# Patient Record
Sex: Female | Born: 1980 | Race: White | Hispanic: No | Marital: Married | Smoking: Former smoker
Health system: Southern US, Community
[De-identification: ages and names within clinical notes are randomized; demographics above are authoritative.]

## PROBLEM LIST (undated history)

## (undated) DIAGNOSIS — Z862 Personal history of diseases of the blood and blood-forming organs and certain disorders involving the immune mechanism: Secondary | ICD-10-CM

## (undated) DIAGNOSIS — J3489 Other specified disorders of nose and nasal sinuses: Secondary | ICD-10-CM

## (undated) DIAGNOSIS — Z8759 Personal history of other complications of pregnancy, childbirth and the puerperium: Secondary | ICD-10-CM

## (undated) DIAGNOSIS — Z973 Presence of spectacles and contact lenses: Secondary | ICD-10-CM

## (undated) DIAGNOSIS — E042 Nontoxic multinodular goiter: Secondary | ICD-10-CM

## (undated) DIAGNOSIS — J302 Other seasonal allergic rhinitis: Secondary | ICD-10-CM

## (undated) DIAGNOSIS — J45990 Exercise induced bronchospasm: Secondary | ICD-10-CM

## (undated) DIAGNOSIS — D497 Neoplasm of unspecified behavior of endocrine glands and other parts of nervous system: Secondary | ICD-10-CM

## (undated) DIAGNOSIS — K219 Gastro-esophageal reflux disease without esophagitis: Secondary | ICD-10-CM

## (undated) HISTORY — PX: TYMPANOSTOMY TUBE PLACEMENT: SHX32

---

## 2012-02-10 ENCOUNTER — Ambulatory Visit (INDEPENDENT_AMBULATORY_CARE_PROVIDER_SITE_OTHER): Payer: 59 | Admitting: Family Medicine

## 2012-02-10 VITALS — BP 114/79 | HR 100 | Temp 98.8°F | Resp 18 | Ht 61.5 in | Wt 137.0 lb

## 2012-02-10 DIAGNOSIS — J45909 Unspecified asthma, uncomplicated: Secondary | ICD-10-CM

## 2012-02-10 DIAGNOSIS — R05 Cough: Secondary | ICD-10-CM

## 2012-02-10 DIAGNOSIS — J45901 Unspecified asthma with (acute) exacerbation: Secondary | ICD-10-CM

## 2012-02-10 DIAGNOSIS — R059 Cough, unspecified: Secondary | ICD-10-CM

## 2012-02-10 MED ORDER — ALBUTEROL SULFATE HFA 108 (90 BASE) MCG/ACT IN AERS: 2.0000 | INHALATION_SPRAY | Freq: Four times a day (QID) | RESPIRATORY_TRACT | Status: DC | PRN

## 2012-02-10 MED ORDER — ALBUTEROL SULFATE (2.5 MG/3ML) 0.083% IN NEBU
2.5000 mg | INHALATION_SOLUTION | Freq: Once | RESPIRATORY_TRACT | Status: AC
  Administered 2012-02-10: 2.5 mg via RESPIRATORY_TRACT

## 2012-02-10 MED ORDER — AZITHROMYCIN 250 MG PO TABS: ORAL_TABLET | ORAL | Status: AC

## 2012-02-10 NOTE — Patient Instructions (Signed)
Drink plenty of fluids. Continue using the Mucinex if helpful. Get plenty of rest. Stay off work today.  Use prescription medications as ordered.

## 2012-02-10 NOTE — Progress Notes (Signed)
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 Has not been running any fever. Had initially a sore throat and head congestion. Now it has moved into her chest with a lot of coughing and wheezing. She is listen to herself and can hear herself wheezing. She took some of her husbands Tessalon. She does not smoke. She has a history of getting a lot of flulike infections through a lot of work. Her husband has not been ill for about 6 weeks. The cough kept her awake. She is missing work today.  Review of systems was otherwise unremarkable for the ears abdomen and head  Objective Alert oriented white female in no acute distress at this time. Her TMs are normal. Nose mild congestion. Throat without erythema. Neck supple without nodes thyromegaly. Chest has diffuse wheezing. She keeps coughing. No rales or rhonchi. Heart regular without murmurs.  Assessment: Acute asthmatic bronchitis Cough  Plan: Albuterol nebulized treatment. Then will decide on home prescription.  Peak flow improved to 300 after a single treatment. Lungs sounded much better.

## 2012-12-15 ENCOUNTER — Ambulatory Visit (INDEPENDENT_AMBULATORY_CARE_PROVIDER_SITE_OTHER): Payer: 59 | Admitting: Physician Assistant

## 2012-12-15 ENCOUNTER — Encounter: Payer: Self-pay | Admitting: Physician Assistant

## 2012-12-15 VITALS — BP 123/79 | HR 81 | Temp 97.6°F | Resp 16 | Ht 63.0 in | Wt 145.8 lb

## 2012-12-15 DIAGNOSIS — J019 Acute sinusitis, unspecified: Secondary | ICD-10-CM

## 2012-12-15 DIAGNOSIS — J029 Acute pharyngitis, unspecified: Secondary | ICD-10-CM

## 2012-12-15 LAB — POCT INFLUENZA A/B
Influenza A, POC: NEGATIVE
Influenza B, POC: NEGATIVE

## 2012-12-15 LAB — POCT RAPID STREP A (OFFICE): Rapid Strep A Screen: NEGATIVE

## 2012-12-15 MED ORDER — IPRATROPIUM BROMIDE 0.06 % NA SOLN: 2.0000 | Freq: Three times a day (TID) | NASAL | Status: DC

## 2012-12-15 MED ORDER — LEVOFLOXACIN 500 MG PO TABS: 500.0000 mg | ORAL_TABLET | Freq: Every day | ORAL | Status: DC

## 2012-12-15 NOTE — Progress Notes (Signed)
Patient ID: Nicole [name] MRN: [id number], DOB: [birth year], 32 y.o. Date of Encounter: 12/15/2012, 10:25 AM  Primary Physician: No primary provider on file.  Chief Complaint:  Chief Complaint  Patient presents with  . Cough    productive cough nasal congestion chest congestion fever chills on and off x 3 1/2 weeks    HPI: 32 y.o. year old female presents with 3 day history of nasal congestion, post nasal drip, sore throat, and sinus pressure. Patient was initially sick 3 and a half weeks ago with chest congestion and cough which she treated with OTC medications and left over Tessalon. Symptoms resolved. She now presents with increased thick green/blood tinged nasal sputum and sinus pressure for three days. Afebrile. No chills. Sinus pressure is the worst symptom. No cough, wheezing, chest pain, or shortness of breath. Ears feel full, leading to sensation of muffled hearing. Has tried OTC cold preps without much success this time. No GI complaints. Appetite normal. She is a Engineer, civil (consulting) at [employer] and has been around multiple sick people, some with influenza. No recent antibiotics or recent travels. No leg trauma, sedentary periods, history of cancer, or current tobacco use. She did receive her influenza vaccine this year.    Past Medical History  Diagnosis Date  . Asthma      Home Meds: Prior to Admission medications   Medication Sig Start Date End Date Taking? Authorizing Provider  acetaminophen (TYLENOL) 325 MG tablet Take 650 mg by mouth every 6 (six) hours as needed.   Yes Historical Provider, MD  albuterol (PROVENTIL HFA;VENTOLIN HFA) 108 (90 BASE) MCG/ACT inhaler Inhale 2 puffs into the lungs every 6 (six) hours as needed for wheezing. 02/10/12 02/09/13 Yes Peyton Najjar, MD  guaiFENesin (MUCINEX) 600 MG 12 hr tablet Take 1,200 mg by mouth 2 (two) times daily.   Yes Historical Provider, MD  albuterol (PROVENTIL) (2.5 MG/3ML) 0.083% nebulizer solution Take 2.5 mg by nebulization every 6  (six) hours as needed.    Historical Provider, MD  benzonatate (TESSALON) 100 MG capsule Take 100 mg by mouth 3 (three) times daily as needed.    Historical Provider, MD                  Allergies: No Known Allergies  History   Social History  . Marital Status: Married    Spouse Name: N/A    Number of Children: N/A  . Years of Education: N/A   Occupational History  . Not on file.   Social History Main Topics  . Smoking status: Former Games developer  . Smokeless tobacco: Not on file  . Alcohol Use: Not on file  . Drug Use: Not on file  . Sexually Active: Not on file   Other Topics Concern  . Not on file   Social History Narrative  . No narrative on file     Review of Systems: Constitutional: positive for fatigue. negative for chills, fever, night sweats or weight changes HEENT: see above Cardiovascular: negative for chest pain or palpitations Respiratory: negative for cough, hemoptysis, wheezing, or shortness of breath Abdominal: negative for abdominal pain, nausea, vomiting or diarrhea Dermatological: negative for rash Neurologic: negative for headache   Physical Exam: Blood pressure 123/79, pulse 81, temperature 97.6 F (36.4 C), temperature source Oral, resp. rate 16, height 5\' 3"  (1.6 m), weight 145 lb 12.8 oz (66.134 kg), SpO2 99.00%., Body mass index is 25.83 kg/(m^2). General: Well developed, well nourished, in no acute distress. Head: Normocephalic, atraumatic,  eyes without discharge, sclera non-icteric, nares are congested. Bilateral auditory canals clear, TM's are without perforation, pearly grey with reflective cone of light bilaterally. Serous effusion bilaterally behind TM's. Maxillary sinus TTP. Oral cavity moist, dentition normal. Posterior pharynx with post nasal drip and mild erythema. No peritonsillar abscess or tonsillar exudate. Uvula midline. Neck: Supple. No thyromegaly. Full ROM. No lymphadenopathy. Lungs: Clear bilaterally to auscultation without  wheezes, rales, or rhonchi. Breathing is unlabored.  Heart: RRR with S1 S2. No murmurs, rubs, or gallops appreciated. Msk:  Strength and tone normal for age. Extremities: No clubbing or cyanosis. No edema. Neuro: Alert and oriented X 3. Moves all extremities spontaneously. CNII-XII grossly in tact. Psych:  Responds to questions appropriately with a normal affect.   Labs: Results for orders placed in visit on 12/15/12  POCT INFLUENZA A/B      Component Value Range   Influenza A, POC Negative     Influenza B, POC Negative    POCT RAPID STREP A (OFFICE)      Component Value Range   Rapid Strep A Screen Negative  Negative   Throat culture pending  ASSESSMENT AND PLAN:  32 y.o. year old female with sinusitis  -Levaquin 500 mg 1 po daily #10 no RF -Atrovent NS 0.06% 2 sprays each nare bid prn #1 no RF -Mucinex -Tylenol/Motrin prn -May call for Tessalon refill if needed -Rest/fluids -RTC precautions -RTC 3-5 days if no improvement  Signed, Eula Listen, PA-C 12/15/2012 10:25 AM

## 2012-12-16 NOTE — Progress Notes (Signed)
Quick Note:  Await final results.  Eula Listen, PA-C 12/16/2012 11:56 AM ______

## 2012-12-17 LAB — CULTURE, GROUP A STREP: Organism ID, Bacteria: NORMAL

## 2013-04-10 ENCOUNTER — Ambulatory Visit (INDEPENDENT_AMBULATORY_CARE_PROVIDER_SITE_OTHER): Payer: 59 | Admitting: Physician Assistant

## 2013-04-10 VITALS — BP 114/77 | HR 80 | Temp 98.2°F | Resp 16 | Ht 62.25 in | Wt 140.0 lb

## 2013-04-10 DIAGNOSIS — L299 Pruritus, unspecified: Secondary | ICD-10-CM

## 2013-04-10 DIAGNOSIS — L255 Unspecified contact dermatitis due to plants, except food: Secondary | ICD-10-CM

## 2013-04-10 MED ORDER — METHYLPREDNISOLONE ACETATE 80 MG/ML IJ SUSP
80.0000 mg | Freq: Once | INTRAMUSCULAR | Status: AC
  Administered 2013-04-10: 80 mg via INTRAMUSCULAR

## 2013-04-10 MED ORDER — TRIAMCINOLONE ACETONIDE 0.5 % EX CREA: TOPICAL_CREAM | Freq: Three times a day (TID) | CUTANEOUS | Status: DC

## 2013-04-10 NOTE — Progress Notes (Signed)
   [address], [place] [address]   Phone [phone]  Subjective:    Patient ID: Nicole [name], female    DOB: [birth year], 32 y.o.   MRN: [id number]  HPI Pt presents to clinic with rhus dermatitis for about 11 days.  Last Sunday she was working in the yard and moved some tree limbs and then the next day she woke up with poison ivy.  She has been using benadryl and calamine type lotions and they are not really helping the itch.  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  She is still getting new lesions today.  She has never had position ivy before.   Review of Systems  Skin: Positive for rash.       Objective:   Physical Exam  Vitals reviewed. Constitutional: She is oriented to person, place, and time. She appears well-developed and well-nourished.  HENT:  Head: Normocephalic and atraumatic.  Right Ear: External ear normal.  Left Ear: External ear normal.  Pulmonary/Chest: Effort normal.  Neurological: She is alert and oriented to person, place, and time.  Skin: Skin is warm and dry. Rash noted. Rash is vesicular (with erythematous base comsistent with rhus dermatitis). Rash is not pustular.     Psychiatric: She has a normal mood and affect. Her behavior is normal. Judgment and thought content normal.          Assessment & Plan:  Rhus dermatitis - Plan: methylPREDNISolone acetate (DEPO-MEDROL) injection 80 mg, triamcinolone cream (KENALOG) 0.5 %  Itching - pt can continue benadryl at night.  She should continue her Zyrtec during the day.  Benny Lennert PA-C 04/10/2013 7:39 PM

## 2013-04-17 ENCOUNTER — Other Ambulatory Visit: Payer: Self-pay | Admitting: Physician Assistant

## 2013-11-01 ENCOUNTER — Encounter: Payer: Self-pay | Admitting: Certified Nurse Midwife

## 2013-11-01 ENCOUNTER — Ambulatory Visit: Payer: 59 | Admitting: Certified Nurse Midwife

## 2013-11-06 ENCOUNTER — Ambulatory Visit: Payer: 59 | Admitting: Certified Nurse Midwife

## 2013-11-23 ENCOUNTER — Ambulatory Visit (INDEPENDENT_AMBULATORY_CARE_PROVIDER_SITE_OTHER): Payer: 59 | Admitting: Certified Nurse Midwife

## 2013-11-23 ENCOUNTER — Encounter: Payer: Self-pay | Admitting: Certified Nurse Midwife

## 2013-11-23 VITALS — BP 104/60 | HR 64 | Resp 16 | Ht 62.0 in | Wt 146.0 lb

## 2013-11-23 DIAGNOSIS — Z0000 Encounter for general adult medical examination without abnormal findings: Secondary | ICD-10-CM

## 2013-11-23 DIAGNOSIS — R319 Hematuria, unspecified: Secondary | ICD-10-CM

## 2013-11-23 DIAGNOSIS — Z01419 Encounter for gynecological examination (general) (routine) without abnormal findings: Secondary | ICD-10-CM

## 2013-11-23 LAB — POCT URINALYSIS DIPSTICK
Bilirubin, UA: NEGATIVE
Glucose, UA: NEGATIVE
Ketones, UA: NEGATIVE
Nitrite, UA: NEGATIVE
Protein, UA: NEGATIVE
Urobilinogen, UA: NEGATIVE
pH, UA: 5

## 2013-11-23 LAB — HEMOGLOBIN, FINGERSTICK: Hemoglobin, fingerstick: 13.1 g/dL (ref 12.0–16.0)

## 2013-11-23 NOTE — Progress Notes (Signed)
32 y.o. G0P0000 Married Caucasian Fe here for annual exam.  Periods normal, no issues. Condoms working well for contraception. Denies urinary, urgency or pain. History of micro hematuria. Patient has noted increase frequency only. Request referral list for PCP. Currently on Z pack for Sinusitis. Sees PCP prn. No other health issues today.  Patient's last menstrual period was 11/04/2013.          Sexually active: yes  The current method of family planning is condoms sometimes.    Exercising: yes  walking Smoker:  no  Health Maintenance: Pap:  10-09-10 MMG:  none Colonoscopy:  none BMD:   none TDaP:  2008 Labs: Poct urine- rbc tr, wbc tr, HGB 13.1 Self breast exam: done occ   reports that she has quit smoking. She does not have any smokeless tobacco history on file. She reports that she drinks about 2.0 ounces of alcohol per week. She reports that she does not use illicit drugs.  Past Medical History  Diagnosis Date  . Asthma   . Hematuria     negative workup with urology    Past Surgical History  Procedure Laterality Date  . Tympanostomy tube placement      Current Outpatient Prescriptions  Medication Sig Dispense Refill  . azithromycin (ZITHROMAX) 250 MG tablet Take by mouth daily.      . cetirizine (ZYRTEC) 10 MG tablet Take 10 mg by mouth daily.      Marland Kitchen Fexofenadine HCl (MUCINEX ALLERGY PO) Take by mouth.      . naproxen sodium (ANAPROX) 220 MG tablet Take 220 mg by mouth as needed.      Marland Kitchen albuterol (PROVENTIL HFA;VENTOLIN HFA) 108 (90 BASE) MCG/ACT inhaler Inhale 2 puffs into the lungs every 6 (six) hours as needed for wheezing.  1 Inhaler  0   No current facility-administered medications for this visit.    Family History  Problem Relation Age of Onset  . Hyperlipidemia Father   . Hypertension Father   . Diabetes Father   . Diabetes Paternal Grandfather   . Hypertension Paternal Grandfather     ROS:  Pertinent items are noted in HPI.  Otherwise, a comprehensive  ROS was negative.  Exam:   BP 104/60  Pulse 64  Resp 16  Ht 5\' 2"  (1.575 m)  Wt 146 lb (66.225 kg)  BMI 26.70 kg/m2  LMP 11/04/2013 Height: 5\' 2"  (157.5 cm)  Ht Readings from Last 3 Encounters:  11/23/13 5\' 2"  (1.575 m)  04/10/13 5' 2.25" (1.581 m)  12/15/12 5\' 3"  (1.6 m)    General appearance: alert, cooperative and appears stated age Head: Normocephalic, without obvious abnormality, atraumatic Neck: no adenopathy, supple, symmetrical, trachea midline and thyroid normal to inspection and palpation and non-palpable Lungs: clear to auscultation bilaterally Negative CVAT bilateral Breasts: normal appearance, no masses or tenderness, No nipple retraction or dimpling, No nipple discharge or bleeding, No axillary or supraclavicular adenopathy Heart: regular rate and rhythm Abdomen: soft, non-tender; no masses,  no organomegaly, negative suprapubic Extremities: extremities normal, atraumatic, no cyanosis or edema Skin: Skin color, texture, turgor normal. No rashes or lesions Lymph nodes: Cervical, supraclavicular, and axillary nodes normal. No abnormal inguinal nodes palpated Neurologic: Grossly normal   Pelvic: External genitalia:  no lesions              Urethra:  normal appearing urethra with no masses, tenderness or lesions, bladder and urethral meatus non tender  Bartholin's and Skene's: normal                 Vagina: normal appearing vagina with normal color and discharge, no lesions              Cervix: normal, non tender              Pap taken: yes Bimanual Exam:  Uterus:  normal size, contour, position, consistency, mobility, non-tender and anteverted              Adnexa: normal adnexa and no mass, fullness, tenderness               Rectovaginal: Confirms               Anus:  normal sphincter tone, no lesions  A:  Well Woman with normal exam  Contraception condoms  Urinary frequency, history of microhematuria with negative urology work up      P:    Reviewed health and wellness pertinent to exam  Pap smear as per guidelines Lab: Urine micro/culture, increase Water intake daily, aware of UTI symptoms  pap smear taken with HPVHR today  counseled on breast self exam, adequate intake of calcium and vitamin D, diet and exercise  return annually or prn  An After Visit Summary was printed and given to the patient.

## 2013-11-23 NOTE — Patient Instructions (Signed)
General topics  Next pap or exam is  due in 1 year Take a Women's multivitamin Take 1200 mg. of calcium daily - prefer dietary If any concerns in interim to call back  Breast Self-Awareness Practicing breast self-awareness may pick up problems early, prevent significant medical complications, and possibly save your life. By practicing breast self-awareness, you can become familiar with how your breasts look and feel and if your breasts are changing. This allows you to notice changes early. It can also offer you some reassurance that your breast health is good. One way to learn what is normal for your breasts and whether your breasts are changing is to do a breast self-exam. If you find a lump or something that was not present in the past, it is best to contact your caregiver right away. Other findings that should be evaluated by your caregiver include nipple discharge, especially if it is bloody; skin changes or reddening; areas where the skin seems to be pulled in (retracted); or new lumps and bumps. Breast pain is seldom associated with cancer (malignancy), but should also be evaluated by a caregiver. BREAST SELF-EXAM The best time to examine your breasts is 5 7 days after your menstrual period is over.  ExitCare Patient Information 2013 [employer].   Exercise to Stay Healthy Exercise helps you become and stay healthy. EXERCISE IDEAS AND TIPS Choose exercises that:  You enjoy.  Fit into your day. You do not need to exercise really hard to be healthy. You can do exercises at a slow or medium level and stay healthy. You can:  Stretch before and after working out.  Try yoga, Pilates, or tai chi.  Lift weights.  Walk fast, swim, jog, run, climb stairs, bicycle, dance, or rollerskate.  Take aerobic classes. Exercises that burn about 150 calories:  Running 1  miles in 15 minutes.  Playing volleyball for 45 to 60 minutes.  Washing and waxing a car for 45 to 60  minutes.  Playing touch football for 45 minutes.  Walking 1  miles in 35 minutes.  Pushing a stroller 1  miles in 30 minutes.  Playing basketball for 30 minutes.  Raking leaves for 30 minutes.  Bicycling 5 miles in 30 minutes.  Walking 2 miles in 30 minutes.  Dancing for 30 minutes.  Shoveling snow for 15 minutes.  Swimming laps for 20 minutes.  Walking up stairs for 15 minutes.  Bicycling 4 miles in 15 minutes.  Gardening for 30 to 45 minutes.  Jumping rope for 15 minutes.  Washing windows or floors for 45 to 60 minutes. Document Released: 01/02/2011 Document Revised: 02/22/2012 Document Reviewed: 01/02/2011 ExitCare Patient Information 2013 [employer].   Other topics ( that may be useful information):    Sexually Transmitted Disease Sexually transmitted disease (STD) refers to any infection that is passed from person to person during sexual activity. This may happen by way of saliva, semen, blood, vaginal mucus, or urine. Common STDs include:  Gonorrhea.  Chlamydia.  Syphilis.  HIV/AIDS.  Genital herpes.  Hepatitis B and C.  Trichomonas.  Human papillomavirus (HPV).  Pubic lice. CAUSES  An STD may be spread by bacteria, virus, or parasite. A person can get an STD by:  Sexual intercourse with an infected person.  Sharing sex toys with an infected person.  Sharing needles with an infected person.  Having intimate contact with the genitals, mouth, or rectal areas of an infected person. SYMPTOMS  Some people may not have any symptoms, but   they can still pass the infection to others. Different STDs have different symptoms. Symptoms include:  Painful or bloody urination.  Pain in the pelvis, abdomen, vagina, anus, throat, or eyes.  Skin rash, itching, irritation, growths, or sores (lesions). These usually occur in the genital or anal area.  Abnormal vaginal discharge.  Penile discharge in men.  Soft, flesh-colored skin growths in the  genital or anal area.  Fever.  Pain or bleeding during sexual intercourse.  Swollen glands in the groin area.  Yellow skin and eyes (jaundice). This is seen with hepatitis. DIAGNOSIS  To make a diagnosis, your caregiver may:  Take a medical history.  Perform a physical exam.  Take a specimen (culture) to be examined.  Examine a sample of discharge under a microscope.  Perform blood test TREATMENT   Chlamydia, gonorrhea, trichomonas, and syphilis can be cured with antibiotic medicine.  Genital herpes, hepatitis, and HIV can be treated, but not cured, with prescribed medicines. The medicines will lessen the symptoms.  Genital warts from HPV can be treated with medicine or by freezing, burning (electrocautery), or surgery. Warts may come back.  HPV is a virus and cannot be cured with medicine or surgery.However, abnormal areas may be followed very closely by your caregiver and may be removed from the cervix, vagina, or vulva through office procedures or surgery. If your diagnosis is confirmed, your recent sexual partners need treatment. This is true even if they are symptom-free or have a negative culture or evaluation. They should not have sex until their caregiver says it is okay. HOME CARE INSTRUCTIONS  All sexual partners should be informed, tested, and treated for all STDs.  Take your antibiotics as directed. Finish them even if you start to feel better.  Only take over-the-counter or prescription medicines for pain, discomfort, or fever as directed by your caregiver.  Rest.  Eat a balanced diet and drink enough fluids to keep your urine clear or pale yellow.  Do not have sex until treatment is completed and you have followed up with your caregiver. STDs should be checked after treatment.  Keep all follow-up appointments, Pap tests, and blood tests as directed by your caregiver.  Only use latex condoms and water-soluble lubricants during sexual activity. Do not use  petroleum jelly or oils.  Avoid alcohol and illegal drugs.  Get vaccinated for HPV and hepatitis. If you have not received these vaccines in the past, talk to your caregiver about whether one or both might be right for you.  Avoid risky sex practices that can break the skin. The only way to avoid getting an STD is to avoid all sexual activity.Latex condoms and dental dams (for oral sex) will help lessen the risk of getting an STD, but will not completely eliminate the risk. SEEK MEDICAL CARE IF:   You have a fever.  You have any new or worsening symptoms. Document Released: 02/20/2003 Document Revised: 02/22/2012 Document Reviewed: 02/27/2011 ExitCare Patient Information 2013 [employer].    Domestic Abuse You are being battered or abused if someone close to you hits, pushes, or physically hurts you in any way. You also are being abused if you are forced into activities. You are being sexually abused if you are forced to have sexual contact of any kind. You are being emotionally abused if you are made to feel worthless or if you are constantly threatened. It is important to remember that help is available. No one has the right to abuse you. PREVENTION OF FURTHER   ABUSE  Learn the warning signs of danger. This varies with situations but may include: the use of alcohol, threats, isolation from friends and family, or forced sexual contact. Leave if you feel that violence is going to occur.  If you are attacked or beaten, report it to the police so the abuse is documented. You do not have to press charges. The police can protect you while you or the attackers are leaving. Get the officer's name and badge number and a copy of the report.  Find someone you can trust and tell them what is happening to you: your caregiver, a nurse, clergy member, close friend or family member. Feeling ashamed is natural, but remember that you have done nothing wrong. No one deserves abuse. Document Released:  11/27/2000 Document Revised: 02/22/2012 Document Reviewed: 02/05/2011 ExitCare Patient Information 2013 [employer].    How Much is Too Much Alcohol? Drinking too much alcohol can cause injury, accidents, and health problems. These types of problems can include:   Car crashes.  Falls.  Family fighting (domestic violence).  Drowning.  Fights.  Injuries.  Burns.  Damage to certain organs.  Having a baby with birth defects. ONE DRINK CAN BE TOO MUCH WHEN YOU ARE:  Working.  Pregnant or breastfeeding.  Taking medicines. Ask your doctor.  Driving or planning to drive. If you or someone you know has a drinking problem, get help from a doctor.  Document Released: 09/26/2009 Document Revised: 02/22/2012 Document Reviewed: 09/26/2009 ExitCare Patient Information 2013 [employer].   Smoking Hazards Smoking cigarettes is extremely bad for your health. Tobacco smoke has over 200 known poisons in it. There are over 60 chemicals in tobacco smoke that cause cancer. Some of the chemicals found in cigarette smoke include:   Cyanide.  Benzene.  Formaldehyde.  Methanol (wood alcohol).  Acetylene (fuel used in welding torches).  Ammonia. Cigarette smoke also contains the poisonous gases nitrogen oxide and carbon monoxide.  Cigarette smokers have an increased risk of many serious medical problems and Smoking causes approximately:  90% of all lung cancer deaths in men.  80% of all lung cancer deaths in women.  90% of deaths from chronic obstructive lung disease. Compared with nonsmokers, smoking increases the risk of:  Coronary heart disease by 2 to 4 times.  Stroke by 2 to 4 times.  Men developing lung cancer by 23 times.  Women developing lung cancer by 13 times.  Dying from chronic obstructive lung diseases by 12 times.  . Smoking is the most preventable cause of death and disease in our society.  WHY IS SMOKING ADDICTIVE?  Nicotine is the chemical  agent in tobacco that is capable of causing addiction or dependence.  When you smoke and inhale, nicotine is absorbed rapidly into the bloodstream through your lungs. Nicotine absorbed through the lungs is capable of creating a powerful addiction. Both inhaled and non-inhaled nicotine may be addictive.  Addiction studies of cigarettes and spit tobacco show that addiction to nicotine occurs mainly during the teen years, when young people begin using tobacco products. WHAT ARE THE BENEFITS OF QUITTING?  There are many health benefits to quitting smoking.   Likelihood of developing cancer and heart disease decreases. Health improvements are seen almost immediately.  Blood pressure, pulse rate, and breathing patterns start returning to normal soon after quitting. QUITTING SMOKING   American Lung Association - 1-800-LUNGUSA  American Cancer Society - 1-800-ACS-2345 Document Released: 01/07/2005 Document Revised: 02/22/2012 Document Reviewed: 09/11/2009 ExitCare Patient Information 2013 Exit[employer].   Stress Management Stress is a state of physical or mental tension that often results from changes in your life or normal routine. Some common causes of stress are:  Death of a loved one.  Injuries or severe illnesses.  Getting fired or changing jobs.  Moving into a new home. Other causes may be:  Sexual problems.  Business or financial losses.  Taking on a large debt.  Regular conflict with someone at home or at work.  Constant tiredness from lack of sleep. It is not just bad things that are stressful. It may be stressful to:  Win the lottery.  Get married.  Buy a new car. The amount of stress that can be easily tolerated varies from person to person. Changes generally cause stress, regardless of the types of change. Too much stress can affect your health. It may lead to physical or emotional problems. Too little stress (boredom) may also become stressful. SUGGESTIONS TO  REDUCE STRESS:  Talk things over with your family and friends. It often is helpful to share your concerns and worries. If you feel your problem is serious, you may want to get help from a professional counselor.  Consider your problems one at a time instead of lumping them all together. Trying to take care of everything at once may seem impossible. List all the things you need to do and then start with the most important one. Set a goal to accomplish 2 or 3 things each day. If you expect to do too many in a single day you will naturally fail, causing you to feel even more stressed.  Do not use alcohol or drugs to relieve stress. Although you may feel better for a short time, they do not remove the problems that caused the stress. They can also be habit forming.  Exercise regularly - at least 3 times per week. Physical exercise can help to relieve that "uptight" feeling and will relax you.  The shortest distance between despair and hope is often a good night's sleep.  Go to bed and get up on time allowing yourself time for appointments without being rushed.  Take a short "time-out" period from any stressful situation that occurs during the day. Close your eyes and take some deep breaths. Starting with the muscles in your face, tense them, hold it for a few seconds, then relax. Repeat this with the muscles in your neck, shoulders, hand, stomach, back and legs.  Take good care of yourself. Eat a balanced diet and get plenty of rest.  Schedule time for having fun. Take a break from your daily routine to relax. HOME CARE INSTRUCTIONS   Call if you feel overwhelmed by your problems and feel you can no longer manage them on your own.  Return immediately if you feel like hurting yourself or someone else. Document Released: 05/26/2001 Document Revised: 02/22/2012 Document Reviewed: 01/16/2008 ExitCare Patient Information 2013 [employer].   

## 2013-11-24 LAB — URINALYSIS, MICROSCOPIC ONLY
Bacteria, UA: NONE SEEN
Casts: NONE SEEN
Crystals: NONE SEEN

## 2013-11-26 LAB — URINE CULTURE: Colony Count: 9000

## 2013-11-27 NOTE — Progress Notes (Signed)
Note reviewed, agree with plan.  Tracy Lathrop, MD  

## 2013-11-29 LAB — IPS PAP TEST WITH HPV

## 2014-08-27 ENCOUNTER — Telehealth: Payer: Self-pay | Admitting: Certified Nurse Midwife

## 2014-08-27 NOTE — Telephone Encounter (Signed)
Patient calling with concern about "bleeding mid-cycle." She says this has never happened to her before and is worried. Please advise?

## 2014-08-27 NOTE — Telephone Encounter (Signed)
Spoke with patient. Patient states that she started her cycle on Aug 29th which lasted for 3 days. "It was a normal period with no cramping or anything." This passed Saturday 9/12 patient started spotting. "When I wipe there is blood. It was bright red and now it is brown." Denies any urinary symptoms or back pain. No abdominal pain. "This has never happened to me before." Advised patient will need to be seen with Regina Eck CNM for evaluation. Patient agreeable. Appointment scheduled for 9/15 at 10:30am. Patient agreeable to date and time.  Routing to provider for final review. Patient agreeable to disposition. Will close encounter

## 2014-08-28 ENCOUNTER — Ambulatory Visit (INDEPENDENT_AMBULATORY_CARE_PROVIDER_SITE_OTHER): Payer: 59 | Admitting: Certified Nurse Midwife

## 2014-08-28 ENCOUNTER — Encounter: Payer: Self-pay | Admitting: Certified Nurse Midwife

## 2014-08-28 VITALS — BP 100/64 | HR 68 | Resp 16 | Ht 62.0 in | Wt 158.0 lb

## 2014-08-28 DIAGNOSIS — N926 Irregular menstruation, unspecified: Secondary | ICD-10-CM

## 2014-08-28 DIAGNOSIS — O200 Threatened abortion: Secondary | ICD-10-CM

## 2014-08-28 DIAGNOSIS — Z3201 Encounter for pregnancy test, result positive: Secondary | ICD-10-CM

## 2014-08-28 LAB — POCT URINE PREGNANCY: Preg Test, Ur: POSITIVE

## 2014-08-28 LAB — HCG, QUANTITATIVE, PREGNANCY: hCG, Beta Chain, Quant, S: 394.5 m[IU]/mL

## 2014-08-28 MED ORDER — CITRANATAL 90 DHA 90-1 & 300 MG PO MISC: 1.0000 | Freq: Every day | ORAL | Status: DC

## 2014-08-28 NOTE — Patient Instructions (Signed)
Vaginal Bleeding During Pregnancy, First Trimester  A small amount of bleeding (spotting) from the vagina is relatively common in early pregnancy. It usually stops on its own. Various things may cause bleeding or spotting in early pregnancy. Some bleeding may be related to the pregnancy, and some may not. In most cases, the bleeding is normal and is not a problem. However, bleeding can also be a sign of something serious. Be sure to tell your health care provider about any vaginal bleeding right away.  Some possible causes of vaginal bleeding during the first trimester include:  · Infection or inflammation of the cervix.  · Growths (polyps) on the cervix.  · Miscarriage or threatened miscarriage.  · Pregnancy tissue has developed outside of the uterus and in a fallopian tube (tubal pregnancy).  · Tiny cysts have developed in the uterus instead of pregnancy tissue (molar pregnancy).  HOME CARE INSTRUCTIONS   Watch your condition for any changes. The following actions may help to lessen any discomfort you are feeling:  · Follow your health care provider's instructions for limiting your activity. If your health care provider orders bed rest, you may need to stay in bed and only get up to use the bathroom. However, your health care provider may allow you to continue light activity.  · If needed, make plans for someone to help with your regular activities and responsibilities while you are on bed rest.  · Keep track of the number of pads you use each day, how often you change pads, and how soaked (saturated) they are. Write this down.  · Do not use tampons. Do not douche.  · Do not have sexual intercourse or orgasms until approved by your health care provider.  · If you pass any tissue from your vagina, save the tissue so you can show it to your health care provider.  · Only take over-the-counter or prescription medicines as directed by your health care provider.  · Do not take aspirin because it can make you  bleed.  · Keep all follow-up appointments as directed by your health care provider.  SEEK MEDICAL CARE IF:  · You have any vaginal bleeding during any part of your pregnancy.  · You have cramps or labor pains.  · You have a fever, not controlled by medicine.  SEEK IMMEDIATE MEDICAL CARE IF:   · You have severe cramps in your back or belly (abdomen).  · You pass large clots or tissue from your vagina.  · Your bleeding increases.  · You feel light-headed or weak, or you have fainting episodes.  · You have chills.  · You are leaking fluid or have a gush of fluid from your vagina.  · You pass out while having a bowel movement.  MAKE SURE YOU:  · Understand these instructions.  · Will watch your condition.  · Will get help right away if you are not doing well or get worse.  Document Released: 09/09/2005 Document Revised: 12/05/2013 Document Reviewed: 08/07/2013  ExitCare® Patient Information ©2015 [employer]. This information is not intended to replace advice given to you by your health care provider. Make sure you discuss any questions you have with your health care provider.

## 2014-08-28 NOTE — Progress Notes (Signed)
33 y.o. Married Caucasian [id number] for evaluation of bleeding, mid cycle. LMP was 08-12-14 light and had mucosy pink discharge starting 2 days ago on 08-26-14. Now only seeing brown/black discharge..Previous period end of July. Contraception none, not planning pregnancy, but aware a possibility. 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 Patient doesn't really keep up with menses, just knows it occurs at the "end of the month". Denies cramping or abdominal pain. Patient has only used Albuterol for asthma and had two drinks of alcohol two days ago in past month. Positive UPT here today. " I did not even consider pregnancy".  O: Healthy female, WD WN Affect: normal orientation X 3   Exam: Abdomen soft, non tender, no rebound Pelvic exam: External genital area: normal, no lesions Vagina: Normal, no blood present Cervix: Nullparous with brown/black discharge noted from cervix Uterus: globular soft feel, non tender Adnexa: non tender, no masses, normal    A: Positive UPT with unplanned? Pregnancy with bleeding, unsure exact dates of LMP, per conversation ? 6-7 weeks per LMP 07/13/14, ? EDC 04-20-15 Normal pelvic exam   P: Discussed findings of normal pelvic exam, positive UPT, and concerns with bleeding in early pregnancy. Questions addressed at length.Warning signs of early pregnancy given and need to advise. Patient to notify of bleeding status tomorrow. Discussed importance of prenatal care, once viability confirmed. Nutritional needs reviewed and foods to avoid. Discussed alcohol not recommended during pregnancy. Questions addressed. Will await results from HCG for status and repeat in 3 days if indicated. Will need PUS for confirmation, but will wait on level first. Discussed no sexual activity. Discussed importance of prenatal vitamins. Rx Prenate DHA see order  Lab: HCG quant., ABO Rh   RV prn as above

## 2014-08-29 ENCOUNTER — Telehealth: Payer: Self-pay | Admitting: Certified Nurse Midwife

## 2014-08-29 DIAGNOSIS — N926 Irregular menstruation, unspecified: Secondary | ICD-10-CM

## 2014-08-29 LAB — ABO AND RH: Rh Type: POSITIVE

## 2014-08-29 NOTE — Telephone Encounter (Signed)
Spoke with patient. Advised patient of results and message as seen below from Regina Eck CNM. Advised Regina Eck CNM spoke with Dr.Miller and would like HCG level be drawn early tomorrow morning. Order was placed as stat order so that we can receive results tomorrow and if patient needs ultrasound we have them available in office tomorrow. Patient is agreeable and verbalizes understanding.  Routing to provider for final review. Patient agreeable to disposition. Will close encounter

## 2014-08-29 NOTE — Telephone Encounter (Signed)
Paper order to Regina Eck CNM for signature.

## 2014-08-29 NOTE — Telephone Encounter (Signed)
Agree 

## 2014-08-29 NOTE — Telephone Encounter (Signed)
Pt works at [employer] and would like an order for blood work(HCG ) please fax it to [phone]

## 2014-08-29 NOTE — Telephone Encounter (Signed)
Message copied by Jasmine Awe on Wed Aug 29, 2014  1:45 PM ------      Message from: Regina Eck      Created: Wed Aug 29, 2014 12:36 PM       Notify patient that HCG level is low, which could indicate just early pregnancy or concern with pregnancy gestational status.      Need to repeat HCG quant. 08/31/14. Order signed. Patient needs to advise if increase in bleeding. Blood Type is AB positive ------

## 2014-08-30 ENCOUNTER — Ambulatory Visit (INDEPENDENT_AMBULATORY_CARE_PROVIDER_SITE_OTHER): Payer: 59

## 2014-08-30 ENCOUNTER — Encounter: Payer: Self-pay | Admitting: Obstetrics and Gynecology

## 2014-08-30 ENCOUNTER — Other Ambulatory Visit: Payer: Self-pay | Admitting: Certified Nurse Midwife

## 2014-08-30 ENCOUNTER — Other Ambulatory Visit: Payer: Self-pay | Admitting: Obstetrics and Gynecology

## 2014-08-30 ENCOUNTER — Ambulatory Visit (INDEPENDENT_AMBULATORY_CARE_PROVIDER_SITE_OTHER): Payer: 59 | Admitting: Obstetrics and Gynecology

## 2014-08-30 VITALS — BP 118/76 | HR 76 | Ht 62.0 in | Wt 158.0 lb

## 2014-08-30 DIAGNOSIS — N926 Irregular menstruation, unspecified: Secondary | ICD-10-CM

## 2014-08-30 DIAGNOSIS — O200 Threatened abortion: Secondary | ICD-10-CM

## 2014-08-30 LAB — US OB TRANSVAGINAL

## 2014-08-30 LAB — HCG, QUANTITATIVE, PREGNANCY: hCG, Beta Chain, Quant, S: 490 m[IU]/mL

## 2014-08-30 NOTE — Progress Notes (Signed)
GYNECOLOGY VISIT  Referring provider:  Evalee Mutton  HPI: 33 y.o.   Married  Caucasian  female   G1P0000 with Patient's last menstrual period was 08/12/2014.   here for ultrasound today for threatened miscarriage.    Seen two days ago for spotting which started 08/26/14. Was more bright red, and now it is light. No pain or discomfort.  Dyspepsia last hs.  No breast tenderness.   Serial beta HCG levels:  394.5 on 9/15 and 490 on 9/17.  Blood type AB positive.  GYNECOLOGIC HISTORY: Patient's last menstrual period was 08/12/2014. Sexually active:  yes Partner preference: female Contraception: none Menopausal hormone therapy: NA   OB History   Grav Para Term Preterm Abortions TAB SAB Ect Mult Living   1 0 0 0 0 0 0 0 0 0        There are no active problems to display for this patient.   Past Medical History  Diagnosis Date  . Asthma   . Hematuria     negative workup with urology    Past Surgical History  Procedure Laterality Date  . Tympanostomy tube placement      Current Outpatient Prescriptions  Medication Sig Dispense Refill  . cetirizine (ZYRTEC) 10 MG tablet Take 10 mg by mouth daily.      . Prenat w/o A-[id number]-DHA (CITRANATAL 90 DHA) 90-1 & 300 MG MISC Take 1 capsule by mouth daily.  90 each  3  . ALBUTEROL IN Inhale into the lungs as needed.      . naproxen sodium (ANAPROX) 220 MG tablet Take 220 mg by mouth as needed.       No current facility-administered medications for this visit.     ALLERGIES: Review of patient's allergies indicates no known allergies.  Family History  Problem Relation Age of Onset  . Hyperlipidemia Father   . Hypertension Father   . Diabetes Father   . Diabetes Paternal Grandfather   . Hypertension Paternal Grandfather     History   Social History  . Marital Status: Married    Spouse Name: N/A    Number of Children: N/A  . Years of Education: N/A   Occupational History  . Not on file.   Social History Main  Topics  . Smoking status: Former Research scientist (life sciences)  . Smokeless tobacco: Not on file  . Alcohol Use: 2.0 - 2.5 oz/week    4-5 drink(s) per week  . Drug Use: No  . Sexual Activity: Yes    Partners: Male   Other Topics Concern  . Not on file   Social History Narrative  . No narrative on file    ROS:  Pertinent items are noted in HPI.  PHYSICAL EXAMINATION:    BP 118/76  Pulse 76  Ht 5\' 2"  (1.575 m)  Wt 158 lb (71.668 kg)  BMI 28.89 kg/m2  LMP 08/12/2014   Wt Readings from Last 3 Encounters:  08/30/14 158 lb (71.668 kg)  08/28/14 158 lb (71.668 kg)  11/23/13 146 lb (66.225 kg)     Ht Readings from Last 3 Encounters:  08/30/14 5\' 2"  (1.575 m)  08/28/14 5\' 2"  (1.575 m)  11/23/13 5\' 2"  (1.575 m)    General appearance: alert, cooperative and appears stated age Head: Normocephalic, without obvious abnormality, atraumatic Neck: no adenopathy, supple, symmetrical, trachea midline and thyroid not enlarged, symmetric, no tenderness/mass/nodules Lungs: clear to auscultation bilaterally Heart: regular rate and rhythm Abdomen: soft, non-tender; no masses,  no organomegaly Extremities: extremities normal, atraumatic, no  cyanosis or edema Skin: Skin color, texture, turgor normal. No rashes or lesions Lymph nodes: Cervical, supraclavicular, and axillary nodes normal. No abnormal inguinal nodes palpated Neurologic: Grossly normal  Pelvic: External genitalia:  no lesions              Urethra:  normal appearing urethra with no masses, tenderness or lesions              Bartholins and Skenes: normal                 Vagina: normal appearing vagina with normal color and discharge, no lesions              Cervix: normal appearance.  Closed.  Small amount of brown mucous.                  Bimanual Exam:  Uterus:  uterus is normal size, shape, consistency and nontender                                      Adnexa: normal adnexa in size, nontender and no masses       Ultrasound -  See full report  below. Images and report reviewed with patient.   No IUP or ectopic seen.  EMS 7.48 mm.  Cervix long and closed.  Right ovary with 2 thick walled cysts and echoes within. Left ovary with thin walled cyst with septation.                                Mild clear free fluid.   ASSESSMENT  Threatened abortion.  Inadequately rising beta HCG levels. No acute abdomen.  Rh positive.   PLAN  Discussion with patient regarding diagnosis and likely cause of failed pregnancy being aneuploidy.  Understands that there is a risk of ectopic pregnancy or intrauterine pregnancy.  Proceed with dilation and evacuation with frozen section tomorrow at 12:00 at [facility].  Possible laparoscopy if no chorionic villi seen.  Risks, benefits, and alternatives discussed with the patient who wishes to proceed.  Discussed risks which include but are not limited to bleeding, infection, damage to surrounding organs, uterine perforation, need for laparoscopy, uterine scarring, cervical incompetence, pneumonia, reaction to anesthesia, possible treatment with methotrexate therapy if no pregnancy found in uterus or fallopian tubes.  NPO after midnight.   An After Visit Summary was printed and given to the patient.  25 minutes face to face time of which over 50% was spent in counseling.

## 2014-08-30 NOTE — Telephone Encounter (Signed)
Pt calling to check on status of bloodwork that was done over at [facility].

## 2014-08-30 NOTE — Telephone Encounter (Signed)
1120: Spoke with patient and advised that we received results. HCG level has raised slightly.  Advised would discuss with provider and would return her call.  Patient agreeable.   1140: Spoke with Dr. Quincy Simmonds, patient to have office visit today and pelvic ultrasound. Spoke with patient and she is agreeable. Will come for appointment at 1530.   1220: Late entry:  Patient advised to be NPO, nothing to eat or drink from now until she is seen by Dr. Quincy Simmonds. Patient states she ate lunch at noon, but will not have anything to eat or drink going forward.  Routing to provider for final review. Patient agreeable to disposition. Will close encounter

## 2014-08-31 ENCOUNTER — Encounter (HOSPITAL_COMMUNITY)
Admission: RE | Disposition: A | Discharge status: Home / Self Care | Payer: Self-pay | Source: Ambulatory Visit | Attending: Obstetrics and Gynecology

## 2014-08-31 ENCOUNTER — Ambulatory Visit (HOSPITAL_COMMUNITY)
Admission: RE | Admit: 2014-08-31 | Discharge: 2014-08-31 | Disposition: A | Discharge status: Home / Self Care | Payer: 59 | Source: Ambulatory Visit | Attending: Obstetrics and Gynecology | Admitting: Obstetrics and Gynecology

## 2014-08-31 ENCOUNTER — Ambulatory Visit (HOSPITAL_COMMUNITY): Payer: 59 | Admitting: Anesthesiology

## 2014-08-31 ENCOUNTER — Encounter (HOSPITAL_COMMUNITY): Payer: Self-pay

## 2014-08-31 ENCOUNTER — Telehealth: Payer: Self-pay | Admitting: Obstetrics and Gynecology

## 2014-08-31 ENCOUNTER — Encounter (HOSPITAL_COMMUNITY): Payer: 59 | Admitting: Anesthesiology

## 2014-08-31 DIAGNOSIS — Z87891 Personal history of nicotine dependence: Secondary | ICD-10-CM | POA: Insufficient documentation

## 2014-08-31 DIAGNOSIS — O009 Unspecified ectopic pregnancy without intrauterine pregnancy: Secondary | ICD-10-CM | POA: Insufficient documentation

## 2014-08-31 DIAGNOSIS — N83209 Unspecified ovarian cyst, unspecified side: Secondary | ICD-10-CM

## 2014-08-31 DIAGNOSIS — N838 Other noninflammatory disorders of ovary, fallopian tube and broad ligament: Secondary | ICD-10-CM | POA: Insufficient documentation

## 2014-08-31 DIAGNOSIS — J45909 Unspecified asthma, uncomplicated: Secondary | ICD-10-CM | POA: Diagnosis not present

## 2014-08-31 DIAGNOSIS — O00109 Unspecified tubal pregnancy without intrauterine pregnancy: Secondary | ICD-10-CM

## 2014-08-31 HISTORY — PX: LAPAROSCOPY: SHX197

## 2014-08-31 HISTORY — PX: BIOPSY: SHX5522

## 2014-08-31 HISTORY — PX: DILATION AND EVACUATION: SHX1459

## 2014-08-31 HISTORY — PX: LAPAROSCOPIC OVARIAN CYSTECTOMY: SHX6248

## 2014-08-31 LAB — CBC
HCT: 38.2 % (ref 36.0–46.0)
Hemoglobin: 13 g/dL (ref 12.0–15.0)
MCH: 31.6 pg (ref 26.0–34.0)
MCHC: 34 g/dL (ref 30.0–36.0)
MCV: 92.9 fL (ref 78.0–100.0)
Platelets: 284 10*3/uL (ref 150–400)
RBC: 4.11 MIL/uL (ref 3.87–5.11)
RDW: 12.4 % (ref 11.5–15.5)
WBC: 7.9 10*3/uL (ref 4.0–10.5)

## 2014-08-31 SURGERY — DILATION AND EVACUATION, UTERUS
Anesthesia: Monitor Anesthesia Care | Site: Abdomen | Laterality: Right

## 2014-08-31 MED ORDER — FENTANYL CITRATE 0.05 MG/ML IJ SOLN
INTRAMUSCULAR | Status: DC | PRN
  Administered 2014-08-31 (×5): 50 ug via INTRAVENOUS

## 2014-08-31 MED ORDER — BUPIVACAINE HCL (PF) 0.25 % IJ SOLN
INTRAMUSCULAR | Status: AC
  Filled 2014-08-31: qty 30

## 2014-08-31 MED ORDER — PROPOFOL 10 MG/ML IV BOLUS
INTRAVENOUS | Status: DC | PRN
  Administered 2014-08-31: 50 mg via INTRAVENOUS
  Administered 2014-08-31 (×2): 20 mg via INTRAVENOUS
  Administered 2014-08-31: 140 mg via INTRAVENOUS
  Administered 2014-08-31: 20 mg via INTRAVENOUS

## 2014-08-31 MED ORDER — OXYCODONE-ACETAMINOPHEN 5-325 MG PO TABS: 2.0000 | ORAL_TABLET | ORAL | Status: DC | PRN

## 2014-08-31 MED ORDER — LIDOCAINE HCL (CARDIAC) 20 MG/ML IV SOLN
INTRAVENOUS | Status: AC
  Filled 2014-08-31: qty 5

## 2014-08-31 MED ORDER — DEXAMETHASONE SODIUM PHOSPHATE 4 MG/ML IJ SOLN
INTRAMUSCULAR | Status: AC
  Filled 2014-08-31: qty 1

## 2014-08-31 MED ORDER — HYDROMORPHONE HCL 1 MG/ML IJ SOLN
INTRAMUSCULAR | Status: DC | PRN
  Administered 2014-08-31: 1 mg via INTRAVENOUS

## 2014-08-31 MED ORDER — SCOPOLAMINE 1 MG/3DAYS TD PT72
MEDICATED_PATCH | TRANSDERMAL | Status: AC
  Administered 2014-08-31: 1.5 mg via TRANSDERMAL
  Filled 2014-08-31: qty 1

## 2014-08-31 MED ORDER — LACTATED RINGERS IV SOLN
INTRAVENOUS | Status: DC
  Administered 2014-08-31: 11:00:00 via INTRAVENOUS

## 2014-08-31 MED ORDER — VASOPRESSIN 20 UNIT/ML IJ SOLN
INTRAVENOUS | Status: DC | PRN
  Administered 2014-08-31: 14:00:00 via INTRAMUSCULAR

## 2014-08-31 MED ORDER — MIDAZOLAM HCL 2 MG/2ML IJ SOLN
INTRAMUSCULAR | Status: AC
  Filled 2014-08-31: qty 2

## 2014-08-31 MED ORDER — NEOSTIGMINE METHYLSULFATE 10 MG/10ML IV SOLN
INTRAVENOUS | Status: DC | PRN
  Administered 2014-08-31: 3 mg via INTRAVENOUS

## 2014-08-31 MED ORDER — ROCURONIUM BROMIDE 100 MG/10ML IV SOLN
INTRAVENOUS | Status: DC | PRN
  Administered 2014-08-31: 30 mg via INTRAVENOUS

## 2014-08-31 MED ORDER — HYDROMORPHONE HCL 1 MG/ML IJ SOLN
INTRAMUSCULAR | Status: AC
  Filled 2014-08-31: qty 1

## 2014-08-31 MED ORDER — CEFAZOLIN SODIUM-DEXTROSE 2-3 GM-% IV SOLR
INTRAVENOUS | Status: AC
  Filled 2014-08-31: qty 50

## 2014-08-31 MED ORDER — FENTANYL CITRATE 0.05 MG/ML IJ SOLN: 25.0000 ug | INTRAMUSCULAR | Status: DC | PRN

## 2014-08-31 MED ORDER — OXYCODONE-ACETAMINOPHEN 5-325 MG PO TABS
1.0000 | ORAL_TABLET | Freq: Once | ORAL | Status: AC
  Administered 2014-08-31: 1 via ORAL

## 2014-08-31 MED ORDER — SCOPOLAMINE 1 MG/3DAYS TD PT72
1.0000 | MEDICATED_PATCH | Freq: Once | TRANSDERMAL | Status: DC
Start: 2014-08-31 — End: 2014-08-31
  Administered 2014-08-31: 1.5 mg via TRANSDERMAL

## 2014-08-31 MED ORDER — LACTATED RINGERS IV SOLN: INTRAVENOUS | Status: DC

## 2014-08-31 MED ORDER — OXYCODONE-ACETAMINOPHEN 5-325 MG PO TABS
ORAL_TABLET | ORAL | Status: AC
  Filled 2014-08-31: qty 1

## 2014-08-31 MED ORDER — MEPERIDINE HCL 25 MG/ML IJ SOLN: 6.2500 mg | INTRAMUSCULAR | Status: DC | PRN

## 2014-08-31 MED ORDER — ROCURONIUM BROMIDE 100 MG/10ML IV SOLN
INTRAVENOUS | Status: AC
  Filled 2014-08-31: qty 1

## 2014-08-31 MED ORDER — KETOROLAC TROMETHAMINE 30 MG/ML IJ SOLN
INTRAMUSCULAR | Status: DC | PRN
  Administered 2014-08-31: 30 mg via INTRAVENOUS

## 2014-08-31 MED ORDER — ONDANSETRON HCL 4 MG/2ML IJ SOLN
INTRAMUSCULAR | Status: DC | PRN
  Administered 2014-08-31: 4 mg via INTRAVENOUS

## 2014-08-31 MED ORDER — PROPOFOL 10 MG/ML IV EMUL
INTRAVENOUS | Status: AC
  Filled 2014-08-31: qty 20

## 2014-08-31 MED ORDER — FENTANYL CITRATE 0.05 MG/ML IJ SOLN
INTRAMUSCULAR | Status: AC
  Filled 2014-08-31: qty 5

## 2014-08-31 MED ORDER — NAPROXEN SODIUM 550 MG PO TABS: 550.0000 mg | ORAL_TABLET | Freq: Two times a day (BID) | ORAL | Status: DC

## 2014-08-31 MED ORDER — LIDOCAINE HCL 1 % IJ SOLN
INTRAMUSCULAR | Status: AC
  Filled 2014-08-31: qty 20

## 2014-08-31 MED ORDER — ONDANSETRON HCL 4 MG/2ML IJ SOLN
INTRAMUSCULAR | Status: AC
  Filled 2014-08-31: qty 2

## 2014-08-31 MED ORDER — MIDAZOLAM HCL 2 MG/2ML IJ SOLN
INTRAMUSCULAR | Status: DC | PRN
  Administered 2014-08-31: 2 mg via INTRAVENOUS

## 2014-08-31 MED ORDER — METOCLOPRAMIDE HCL 5 MG/ML IJ SOLN
10.0000 mg | Freq: Once | INTRAMUSCULAR | Status: DC | PRN
Start: 2014-08-31 — End: 2014-08-31

## 2014-08-31 MED ORDER — CEFAZOLIN SODIUM-DEXTROSE 2-3 GM-% IV SOLR
2.0000 g | INTRAVENOUS | Status: AC
  Administered 2014-08-31: 2 g via INTRAVENOUS

## 2014-08-31 MED ORDER — GLYCOPYRROLATE 0.2 MG/ML IJ SOLN
INTRAMUSCULAR | Status: DC | PRN
  Administered 2014-08-31: 0.4 mg via INTRAVENOUS

## 2014-08-31 MED ORDER — LACTATED RINGERS IR SOLN
Status: DC | PRN
  Administered 2014-08-31: 3000 mL

## 2014-08-31 MED ORDER — LIDOCAINE HCL (CARDIAC) 20 MG/ML IV SOLN
INTRAVENOUS | Status: DC | PRN
  Administered 2014-08-31: 40 mg via INTRAVENOUS

## 2014-08-31 MED ORDER — BUPIVACAINE HCL (PF) 0.25 % IJ SOLN
INTRAMUSCULAR | Status: DC | PRN
  Administered 2014-08-31: 8 mL

## 2014-08-31 MED ORDER — LIDOCAINE HCL 1 % IJ SOLN
INTRAMUSCULAR | Status: DC | PRN
  Administered 2014-08-31: 10 mL

## 2014-08-31 MED ORDER — DEXAMETHASONE SODIUM PHOSPHATE 10 MG/ML IJ SOLN
INTRAMUSCULAR | Status: DC | PRN
  Administered 2014-08-31: 4 mg via INTRAVENOUS

## 2014-08-31 SURGICAL SUPPLY — 44 items
BARRIER ADHS 3X4 INTERCEED (GAUZE/BANDAGES/DRESSINGS) IMPLANT
BENZOIN TINCTURE PRP APPL 2/3 (GAUZE/BANDAGES/DRESSINGS) IMPLANT
BLADE SURG 11 STRL SS (BLADE) ×4 IMPLANT
CABLE HIGH FREQUENCY MONO STRZ (ELECTRODE) ×4 IMPLANT
CANISTER SUCT 3000ML (MISCELLANEOUS) ×4 IMPLANT
CATH ROBINSON RED A/P 16FR (CATHETERS) IMPLANT
CLOTH BEACON ORANGE TIMEOUT ST (SAFETY) ×4 IMPLANT
DECANTER SPIKE VIAL GLASS SM (MISCELLANEOUS) ×4 IMPLANT
DERMABOND ADVANCED (GAUZE/BANDAGES/DRESSINGS)
DERMABOND ADVANCED .7 DNX12 (GAUZE/BANDAGES/DRESSINGS) IMPLANT
DRSG COVADERM PLUS 2X2 (GAUZE/BANDAGES/DRESSINGS) ×8 IMPLANT
DRSG OPSITE POSTOP 3X4 (GAUZE/BANDAGES/DRESSINGS) IMPLANT
FORCEPS CUTTING 33CM 5MM (CUTTING FORCEPS) IMPLANT
GLOVE BIO SURGEON STRL SZ 6.5 (GLOVE) ×8 IMPLANT
GOWN STRL REUS W/TWL LRG LVL3 (GOWN DISPOSABLE) ×8 IMPLANT
IV LACTATED RINGERS 1000ML (IV SOLUTION) ×4 IMPLANT
KIT BERKELEY 1ST TRIMESTER 3/8 (MISCELLANEOUS) ×4 IMPLANT
NEEDLE SPNL 22GX7 QUINCKE BK (NEEDLE) ×4 IMPLANT
NS IRRIG 1000ML POUR BTL (IV SOLUTION) ×4 IMPLANT
PACK LAPAROSCOPY BASIN (CUSTOM PROCEDURE TRAY) ×4 IMPLANT
PACK VAGINAL MINOR WOMEN LF (CUSTOM PROCEDURE TRAY) ×4 IMPLANT
PAD OB MATERNITY 4.3X12.25 (PERSONAL CARE ITEMS) ×4 IMPLANT
PAD PREP 24X48 CUFFED NSTRL (MISCELLANEOUS) ×4 IMPLANT
POUCH SPECIMEN RETRIEVAL 10MM (ENDOMECHANICALS) ×4 IMPLANT
PROTECTOR NERVE ULNAR (MISCELLANEOUS) ×4 IMPLANT
SCISSORS LAP 5X35 DISP (ENDOMECHANICALS) IMPLANT
SET BERKELEY SUCTION TUBING (SUCTIONS) ×4 IMPLANT
SET IRRIG TUBING LAPAROSCOPIC (IRRIGATION / IRRIGATOR) ×8 IMPLANT
SOLUTION ELECTROLUBE (MISCELLANEOUS) IMPLANT
STRIP CLOSURE SKIN 1/4X3 (GAUZE/BANDAGES/DRESSINGS) IMPLANT
SUT PLAIN 3 0 FS 2 27 (SUTURE) ×4 IMPLANT
SUT VIC AB 4-0 PS2 18 (SUTURE) ×4 IMPLANT
SUT VICRYL 0 UR6 27IN ABS (SUTURE) IMPLANT
TOWEL OR 17X24 6PK STRL BLUE (TOWEL DISPOSABLE) ×8 IMPLANT
TRAY FOLEY CATH 14FR (SET/KITS/TRAYS/PACK) ×4 IMPLANT
TROCAR BLADELESS OPT 5 75 (ENDOMECHANICALS) ×4 IMPLANT
TROCAR XCEL DIL TIP R 11M (ENDOMECHANICALS) IMPLANT
VACURETTE 10 RIGID CVD (CANNULA) IMPLANT
VACURETTE 6 ASPIR F TIP BERK (CANNULA) ×4 IMPLANT
VACURETTE 7MM CVD STRL WRAP (CANNULA) IMPLANT
VACURETTE 8 RIGID CVD (CANNULA) IMPLANT
VACURETTE 9 RIGID CVD (CANNULA) IMPLANT
WARMER LAPAROSCOPE (MISCELLANEOUS) ×4 IMPLANT
WATER STERILE IRR 1000ML POUR (IV SOLUTION) ×4 IMPLANT

## 2014-08-31 NOTE — Telephone Encounter (Signed)
Opened in error. No phone call done.

## 2014-08-31 NOTE — Anesthesia Preprocedure Evaluation (Signed)
Anesthesia Evaluation  Patient identified by MRN, date of birth, ID band Patient awake    Reviewed: Allergy & Precautions, H&P , NPO status , Patient's Chart, lab work & pertinent test results  Airway Mallampati: II TM Distance: >3 FB Neck ROM: Full    Dental no notable dental hx. (+) Teeth Intact   Pulmonary asthma , former smoker,  breath sounds clear to auscultation  Pulmonary exam normal       Cardiovascular negative cardio ROS  Rhythm:Regular Rate:Normal     Neuro/Psych negative neurological ROS  negative psych ROS   GI/Hepatic negative GI ROS, Neg liver ROS,   Endo/Other  negative endocrine ROS  Renal/GU negative Renal ROS  negative genitourinary   Musculoskeletal negative musculoskeletal ROS (+)   Abdominal   Peds  Hematology negative hematology ROS (+)   Anesthesia Other Findings   Reproductive/Obstetrics (+) Pregnancy Missed Ab                           Anesthesia Physical Anesthesia Plan  ASA: II  Anesthesia Plan: MAC   Post-op Pain Management:    Induction: Intravenous  Airway Management Planned: Natural Airway  Additional Equipment:   Intra-op Plan:   Post-operative Plan:   Informed Consent: I have reviewed the patients History and Physical, chart, labs and discussed the procedure including the risks, benefits and alternatives for the proposed anesthesia with the patient or authorized representative who has indicated his/her understanding and acceptance.     Plan Discussed with: Anesthesiologist, CRNA and Surgeon  Anesthesia Plan Comments:         Anesthesia Quick Evaluation

## 2014-08-31 NOTE — Discharge Instructions (Signed)
Nicole [name], I did your dilation and evacuation procedure and there was no sign of pregnancy in the uterus. I proceeded with the laparoscopy and found and removed an ectopic pregnancy from your right fallopian tube.  You also had cysts of your left and right ovaries and your right fallopian tube, and I drained all of them.  I then wrapped your tubes in an adhesion barrier.  I did not remove any organs.  Your surgery went very well, and I am pleased for you that we found and treated the ectopic pregnancy.    Nicole Half, MD  Diagnostic Laparoscopy Laparoscopy is a surgical procedure. It is used to diagnose and treat diseases inside the belly (abdomen). It is usually a brief, common, and relatively simple procedure. The laparoscopeis a thin, lighted, pencil-sized instrument. It is like a telescope. It is inserted into your abdomen through a small cut (incision). Your caregiver can look at the organs inside your body through this instrument. He or she can see if there is anything abnormal. Laparoscopy can be done either in a hospital or outpatient clinic. You may be given a mild sedative to help you relax before the procedure. Once in the operating room, you will be given a drug to make you sleep (general anesthesia). Laparoscopy usually lasts less than 1 hour. After the procedure, you will be monitored in a recovery area until you are stable and doing well. Once you are home, it will take 2 to 3 days to fully recover. RISKS AND COMPLICATIONS  Laparoscopy has relatively few risks. Your caregiver will discuss the risks with you before the procedure. Some problems that can occur include:  Infection.  Bleeding.  Damage to other organs.  Anesthetic side effects. PROCEDURE Once you receive anesthesia, your surgeon inflates the abdomen with a harmless gas (carbon dioxide). This makes the organs easier to see. The laparoscope is inserted into the abdomen through a small incision. This allows your surgeon to see  into the abdomen. Other small instruments are also inserted into the abdomen through other small openings. Many surgeons attach a video camera to the laparoscope to enlarge the view. During a diagnostic laparoscopy, the surgeon may be looking for inflammation, infection, or cancer. Your surgeon may take tissue samples(biopsies). The samples are sent to a specialist in looking at cells and tissue samples (pathologist). The pathologist examines them under a microscope. Biopsies can help to diagnose or confirm a disease. AFTER THE PROCEDURE   The gas is released from inside the abdomen.  The incisions are closed with stitches (sutures). Because these incisions are small (usually less than 1/2 inch), there is usually minimal discomfort after the procedure. There may be some mild discomfort in the throat. This is from the tube placed in the throat while you were sleeping. You may have some mild abdominal discomfort. There may also be discomfort from the instrument placement incisions in the abdomen.  The recovery time is shortened as long as there are no complications.  You will rest in a recovery room until stable and doing well. As long as there are no complications, you may be allowed to go home. FINDING OUT THE RESULTS OF YOUR TEST Not all test results are available during your visit. If your test results are not back during the visit, make an appointment with your caregiver to find out the results. Do not assume everything is normal if you have not heard from your caregiver or the medical facility. It is important for you to follow  up on all of your test results. HOME CARE INSTRUCTIONS   Take all medicines as directed.  Only take over-the-counter or prescription medicines for pain, discomfort, or fever as directed by your caregiver.  Resume daily activities as directed.  Showers are preferred over baths.  You may resume sexual activities in 1 week or as directed.  Do not drive while taking  narcotics. SEEK MEDICAL CARE IF:   There is increasing abdominal pain.  There is new pain in the shoulders (shoulder strap areas).  You feel lightheaded or faint.  You have the chills.  You or your child has an oral temperature above 102 F (38.9 C).  There is pus-like (purulent) drainage from any of the wounds.  You are unable to pass gas or have a bowel movement.  You feel sick to your stomach (nauseous) or throw up (vomit). MAKE SURE YOU:   Understand these instructions.  Will watch your condition.  Will get help right away if you are not doing well or get worse. Document Released: 03/08/2001 Document Revised: 03/27/2013 Document Reviewed: 11/30/2007 [facility] Patient Information 2015 [place], [place]. This information is not intended to replace advice given to you by your health care provider. Make sure you discuss any questions you have with your health care provider.  Dilation and Curettage or Vacuum Curettage Dilation and curettage (D&C) and vacuum curettage are minor procedures. A D&C involves stretching (dilation) the cervix and scraping (curettage) the inside lining of the womb (uterus). During a D&C, tissue is gently scraped from the inside lining of the uterus. During a vacuum curettage, the lining and tissue in the uterus are removed with the use of gentle suction.  Curettage may be performed to either diagnose or treat a problem. As a diagnostic procedure, curettage is performed to examine tissues from the uterus. A diagnostic curettage may be performed for the following symptoms:   Irregular bleeding in the uterus.   Bleeding with the development of clots.   Spotting between menstrual periods.   Prolonged menstrual periods.   Bleeding after menopause.   No menstrual period (amenorrhea).   A change in size and shape of the uterus.  As a treatment procedure, curettage may be performed for the following reasons:   Removal of an IUD (intrauterine  device).   Removal of retained placenta after giving birth. Retained placenta can cause an infection or bleeding severe enough to require transfusions.   Abortion.   Miscarriage.   Removal of polyps inside the uterus.   Removal of uncommon types of noncancerous lumps (fibroids).  LET [facility] CARE PROVIDER KNOW ABOUT:   Any allergies you have.   All medicines you are taking, including vitamins, herbs, eye drops, creams, and over-the-counter medicines.   Previous problems you or members of your family have had with the use of anesthetics.   Any blood disorders you have.   Previous surgeries you have had.   Medical conditions you have. RISKS AND COMPLICATIONS  Generally, this is a safe procedure. However, as with any procedure, complications can occur. Possible complications include:  Excessive bleeding.   Infection of the uterus.   Damage to the cervix.   Development of scar tissue (adhesions) inside the uterus, later causing abnormal amounts of menstrual bleeding.   Complications from the general anesthetic, if a general anesthetic is used.   Putting a hole (perforation) in the uterus. This is rare.  BEFORE THE PROCEDURE   Eat and drink before the procedure only as directed by your  health care provider.   Arrange for someone to take you home.  PROCEDURE  This procedure usually takes about 15-30 minutes.  You will be given one of the following:  A medicine that numbs the area in and around the cervix (local anesthetic).   A medicine to make you sleep through the procedure (general anesthetic).  You will lie on your back with your legs in stirrups.   A warm metal or plastic instrument (speculum) will be placed in your vagina to keep it open and to allow the health care provider to see the cervix.  There are two ways in which your cervix can be softened and dilated. These include:   Taking a medicine.   Having thin rods (laminaria)  inserted into your cervix.   A curved tool (curette) will be used to scrape cells from the inside lining of the uterus. In some cases, gentle suction is applied with the curette. The curette will then be removed.  AFTER THE PROCEDURE   You will rest in the recovery area until you are stable and are ready to go home.   You may feel sick to your stomach (nauseous) or throw up (vomit) if you were given a general anesthetic.   You may have a sore throat if a tube was placed in your throat during general anesthesia.   You may have light cramping and bleeding. This may last for 2 days to 2 weeks after the procedure.   Your uterus needs to make a new lining after the procedure. This may make your next period late. Document Released: 11/30/2005 Document Revised: 08/02/2013 Document Reviewed: 06/29/2013 [facility] Patient Information 2015 [place], [place]. This information is not intended to replace advice given to you by your health care provider. Make sure you discuss any questions you have with your health care provider.  Ectopic Pregnancy An ectopic pregnancy is when the fertilized egg attaches (implants) outside the uterus. Most ectopic pregnancies occur in the fallopian tube. Rarely do ectopic pregnancies occur on the ovary, intestine, pelvis, or cervix. In an ectopic pregnancy, the fertilized egg does not have the ability to develop into a normal, healthy baby.  A ruptured ectopic pregnancy is one in which the fallopian tube gets torn or bursts and results in internal bleeding. Often there is intense abdominal pain, and sometimes, vaginal bleeding. Having an ectopic pregnancy can be life threatening. If left untreated, this dangerous condition can lead to a blood transfusion, abdominal surgery, or even death. CAUSES  Damage to the fallopian tubes is the suspected cause in most ectopic pregnancies.  RISK FACTORS Depending on your circumstances, the risk of having an ectopic pregnancy will  vary. The level of risk can be divided into three categories. High Risk  You have gone through infertility treatment.  You have had a previous ectopic pregnancy.  You have had previous tubal surgery.  You have had previous surgery to have the fallopian tubes tied (tubal ligation).  You have tubal problems or diseases.  You have been exposed to DES. DES is a medicine that was used until 1971 and had effects on babies whose mothers took the medicine.  You become pregnant while using an intrauterine device (IUD) for birth control. Moderate Risk  You have a history of infertility.  You have a history of a sexually transmitted infection (STI).  You have a history of pelvic inflammatory disease (PID).  You have scarring from endometriosis.  You have multiple sexual partners.  You smoke. Low Risk  You have had previous pelvic surgery.  You use vaginal douching.  You became sexually active before 33 years of age. SIGNS AND SYMPTOMS  An ectopic pregnancy should be suspected in anyone who has missed a period and has abdominal pain or bleeding.  You may experience normal pregnancy symptoms, such as:  Nausea.  Tiredness.  Breast tenderness.  Other symptoms may include:  Pain with intercourse.  Irregular vaginal bleeding or spotting.  Cramping or pain on one side or in the lower abdomen.  Fast heartbeat.  Passing out while having a bowel movement.  Symptoms of a ruptured ectopic pregnancy and internal bleeding may include:  Sudden, severe pain in the abdomen and pelvis.  Dizziness or fainting.  Pain in the shoulder area. DIAGNOSIS  Tests that may be performed include:  A pregnancy test.  An ultrasound test.  Testing the specific level of pregnancy hormone in the bloodstream.  Taking a sample of uterus tissue (dilation and curettage, D&C).  Surgery to perform a visual exam of the inside of the abdomen using a thin, lighted tube with a tiny camera on  the end (laparoscope). TREATMENT  An injection of a medicine called methotrexate may be given. This medicine causes the pregnancy tissue to be absorbed. It is given if:  The diagnosis is made early.  The fallopian tube has not ruptured.  You are considered to be a good candidate for the medicine. Usually, pregnancy hormone blood levels are checked after methotrexate treatment. This is to be sure the medicine is effective. It may take 4-6 weeks for the pregnancy to be absorbed (though most pregnancies will be absorbed by 3 weeks). Surgical treatment may be needed. A laparoscope may be used to remove the pregnancy tissue. If severe internal bleeding occurs, a cut (incision) may be made in the lower abdomen (laparotomy), and the ectopic pregnancy is removed. This stops the bleeding. Part of the fallopian tube, or the whole tube, may be removed as well (salpingectomy). After surgery, pregnancy hormone tests may be done to be sure there is no pregnancy tissue left. You may receive a Rho (D) immune globulin shot if you are Rh negative and the father is Rh positive, or if you do not know the Rh type of the father. This is to prevent problems with any future pregnancy. SEEK IMMEDIATE MEDICAL CARE IF:  You have any symptoms of an ectopic pregnancy. This is a medical emergency. MAKE SURE YOU:  Understand these instructions.  Will watch your condition.  Will get help right away if you are not doing well or get worse. Document Released: 01/07/2005 Document Revised: 04/16/2014 Document Reviewed: 06/29/2013 [facility] Patient Information 2015 [place], [place]. This information is not intended to replace advice given to you by your health care provider. Make sure you discuss any questions you have with your health care provider.    DO NOT TAKE ANY IBUPROFEN PRODUCTS UNTIL 9 PM TONIGHT.

## 2014-08-31 NOTE — H&P (Signed)
Nicole E Amundson de Berton Lan, MD at 08/30/2014  4:07 PM      Status: Signed            GYNECOLOGY VISIT  Referring provider:  Evalee Mutton  HPI: 33 y.o.   Married  Caucasian  female    G1P0000 with Patient's last menstrual period was 08/12/2014.    here for ultrasound today for threatened miscarriage.      Seen two days ago for spotting which started 08/26/14. Was more bright red, and now it is light. No pain or discomfort.   Dyspepsia last hs.   No breast tenderness.   Serial beta HCG levels:  394.5 on 9/15 and 490 on 9/17.  Blood type AB positive.  GYNECOLOGIC HISTORY: Patient's last menstrual period was 08/12/2014. Sexually active:  yes Partner preference: female Contraception: none Menopausal hormone therapy: NA    OB History     Grav  Para  Term  Preterm  Abortions  TAB  SAB  Ect  Mult  Living     1  0  0  0  0  0  0  0  0  0         There are no active problems to display for this patient.     Past Medical History   Diagnosis  Date   .  Asthma     .  Hematuria         negative workup with urology       Past Surgical History   Procedure  Laterality  Date   .  Tympanostomy tube placement           Current Outpatient Prescriptions   Medication  Sig  Dispense  Refill   .  cetirizine (ZYRTEC) 10 MG tablet  Take 10 mg by mouth daily.         .  Prenat w/o A-FeCbGl-DSS-FA-DHA (CITRANATAL 90 DHA) 90-1 & 300 MG MISC  Take 1 capsule by mouth daily.   90 each   3   .  ALBUTEROL IN  Inhale into the lungs as needed.         .  naproxen sodium (ANAPROX) 220 MG tablet  Take 220 mg by mouth as needed.            No current facility-administered medications for this visit.      ALLERGIES: Review of patient's allergies indicates no known allergies.    Family History   Problem  Relation  Age of Onset   .  Hyperlipidemia  Father     .  Hypertension  Father     .  Diabetes  Father     .  Diabetes  Paternal Grandfather     .  Hypertension  Paternal  Grandfather         History      Social History   .  Marital Status:  Married       Spouse Name:  N/A       Number of Children:  N/A   .  Years of Education:  N/A      Occupational History   .  Not on file.      Social History Main Topics   .  Smoking status:  Former Research scientist (life sciences)   .  Smokeless tobacco:  Not on file   .  Alcohol Use:  2.0 - 2.5 oz/week       4-5 drink(s) per week   .  Drug Use:  No   .  Sexual Activity:  Yes       Partners:  Male      Other Topics  Concern   .  Not on file      Social History Narrative   .  No narrative on file     ROS:  Pertinent items are noted in HPI.  PHYSICAL EXAMINATION:    BP 118/76  Pulse 76  Ht 5\' 2"  (1.575 m)  Wt 158 lb (71.668 kg)  BMI 28.89 kg/m2  LMP 08/12/2014    Wt Readings from Last 3 Encounters:   08/30/14  158 lb (71.668 kg)   08/28/14  158 lb (71.668 kg)   11/23/13  146 lb (66.225 kg)       Ht Readings from Last 3 Encounters:   08/30/14  5\' 2"  (1.575 m)   08/28/14  5\' 2"  (1.575 m)   11/23/13  5\' 2"  (1.575 m)     General appearance: alert, cooperative and appears stated age Head: Normocephalic, without obvious abnormality, atraumatic Neck: no adenopathy, supple, symmetrical, trachea midline and thyroid not enlarged, symmetric, no tenderness/mass/nodules Lungs: clear to auscultation bilaterally Heart: regular rate and rhythm Abdomen: soft, non-tender; no masses,  no organomegaly Extremities: extremities normal, atraumatic, no cyanosis or edema Skin: Skin color, texture, turgor normal. No rashes or lesions Lymph nodes: Cervical, supraclavicular, and axillary nodes normal. No abnormal inguinal nodes palpated Neurologic: Grossly normal  Pelvic: External genitalia:  no lesions              Urethra:  normal appearing urethra with no masses, tenderness or lesions              Bartholins and Skenes: normal                  Vagina: normal appearing vagina with normal color and discharge, no lesions               Cervix: normal appearance.  Closed.  Small amount of brown mucous.                    Bimanual Exam:  Uterus:  uterus is normal size, shape, consistency and nontender                                      Adnexa: normal adnexa in size, nontender and no masses        Ultrasound -  See full report below. Images and report reviewed with patient.   No IUP or ectopic seen.   EMS 7.48 mm.   Cervix long and closed.   Right ovary with 2 thick walled cysts and echoes within. Left ovary with thin walled cyst with septation.                                 Mild clear free fluid.   ASSESSMENT  Threatened abortion.   Inadequately rising beta HCG levels. No acute abdomen.   Rh positive.   PLAN  Discussion with patient regarding diagnosis and likely cause of failed pregnancy being aneuploidy.  Understands that there is a risk of ectopic pregnancy or intrauterine pregnancy.   Proceed with dilation and evacuation with frozen section tomorrow at 12:00 at [facility].  Possible laparoscopy if no chorionic villi seen.  Risks, benefits, and alternatives  discussed with the patient who wishes to proceed.   Discussed risks which include but are not limited to bleeding, infection, damage to surrounding organs, uterine perforation, need for laparoscopy, uterine scarring, cervical incompetence, pneumonia, reaction to anesthesia, possible treatment with methotrexate therapy if no pregnancy found in uterus or fallopian tubes.   NPO after midnight.   An After Visit Summary was printed and given to the patient.  25 minutes face to face time of which over 50% was spent in counseling.    Addendum - update note  No change in status.  Patient examined.   OK to proceed with surgery.

## 2014-08-31 NOTE — Anesthesia Postprocedure Evaluation (Signed)
Anesthesia Post Note  Patient: Nicole [name]  Procedure(s) Performed: Procedure(s) (LRB): DILATATION AND EVACUATION, FROZEN SECTION (N/A)  LAPAROSCOPY OPERATIVE  right salpinostomy  (N/A) LAPAROSCOPIC bilateral  OVARIAN CYSTs drainage  drainage of right  tubal cyst (Right) BIOPSY right peritoneal side wall  (Right)  Anesthesia type: General  Patient location: PACU  Post pain: Pain level controlled  Post assessment: Post-op Vital signs reviewed  Last Vitals:  Filed Vitals:   08/31/14 1545  BP: 108/59  Pulse: 74  Temp:   Resp: 10    Post vital signs: Reviewed  Level of consciousness: sedated  Complications: No apparent anesthesia complications

## 2014-08-31 NOTE — Brief Op Note (Signed)
08/31/2014  6:53 PM  PATIENT:  Nicole [name]  33 y.o. female  PRE-OPERATIVE DIAGNOSIS:  Incomplete Abortion  POST-OPERATIVE DIAGNOSIS:  Right ectopic pregnancy, bilateral ovarian cysts, right paratubal cyst.  PROCEDURE:  Procedure(s): DILATATION AND EVACUATION, FROZEN SECTION (N/A)  LAPAROSCOPY OPERATIVE  right salpinostomy  (N/A) LAPAROSCOPIC bilateral  OVARIAN CYSTs drainage  drainage of right  tubal cyst (Right) BIOPSY right peritoneal side wall  (Right)  SURGEON:  Surgeon(s) and Role:    * Brook E Amundson de Berton Lan, MD - Primary  PHYSICIAN ASSISTANT:   ASSISTANTS:  Elveria Rising, MD   ANESTHESIA:   local, general, paracervical block and MAC  EBL:  Total I/O In: 2400 [I.V.:2400] Out: 650 [Urine:575; Blood:75]  BLOOD ADMINISTERED:none  DRAINS: Urinary Catheter (Foley)   LOCAL MEDICATIONS USED:  MARCAINE    and LIDOCAINE   SPECIMEN:  Source of Specimen:   Products of conception from uterus, right ectopic pregnancy, right peritoneal side wall biopsy  DISPOSITION OF SPECIMEN:  PATHOLOGY  COUNTS:  YES  TOURNIQUET:  * No tourniquets in log *  DICTATION: .Other Dictation: Dictation Number    PLAN OF CARE: Discharge to home after PACU  PATIENT DISPOSITION:  PACU - hemodynamically stable.   Delay start of Pharmacological VTE agent (>24hrs) due to surgical blood loss or risk of bleeding: not applicable

## 2014-08-31 NOTE — Transfer of Care (Signed)
Immediate Anesthesia Transfer of Care Note  Patient: Nicole [name]  Procedure(s) Performed: Procedure(s): DILATATION AND EVACUATION, FROZEN SECTION (N/A)  LAPAROSCOPY OPERATIVE  right salpinostomy  (N/A) LAPAROSCOPIC bilateral  OVARIAN CYSTs drainage  drainage of right  tubal cyst (Right) BIOPSY right peritoneal side wall  (Right)  Patient Location: PACU  Anesthesia Type:General  Level of Consciousness: awake, alert  and oriented  Airway & Oxygen Therapy: Patient Spontanous Breathing and Patient connected to nasal cannula oxygen  Post-op Assessment: Report given to PACU RN and Post -op Vital signs reviewed and stable  Post vital signs: Reviewed and stable  Complications: No apparent anesthesia complications

## 2014-09-01 NOTE — Op Note (Signed)
Nicole [name], [name]                   ACCOUNT NO.:  [id number]  MEDICAL RECORD NO.:  [id number]  LOCATION:  [facility]                          FACILITY:  [facility]  PHYSICIAN:  Lenard Galloway, M.D.   DATE OF BIRTH:  [birth year]  DATE OF PROCEDURE:  08/31/2014 DATE OF DISCHARGE:  08/31/2014                              OPERATIVE REPORT   PREOPERATIVE DIAGNOSIS:  Abnormally rising quantitative beta hCG.  POSTOPERATIVE DIAGNOSES:  Right ectopic pregnancy, bilateral ovarian cysts, right paratubal cyst, peritoneal implant of potential ectopic pregnancy tissue.  PROCEDURE:  Exam under anesthesia, dilation and evacuation with frozen section, laparoscopy with right salpingostomy and removal of ectopic pregnancy, laparoscopic drainage of bilateral ovarian cysts, laparoscopic drainage of right paratubal cyst, right pelvic sidewall peritoneal biopsy and removal of peritoneal lesion.  SURGEON:  Lenard Galloway, M.D.  ASSISTANT:  Alver Sorrow. Charlies Constable, M.D.  ANESTHESIA:  General endotracheal, conversion after MAC anesthesia with paracervical block, local 0.25% Marcaine to the skin.  IV FLUIDS:  2400 mL Ringer's lactate.  ESTIMATED BLOOD LOSS:  75 mL.  URINE OUTPUT:  575 mL.  COMPLICATIONS:  None.  INDICATIONS FOR THE PROCEDURE:  The patient is a 33 year old gravida 1, para 79 Caucasian female who presented to the office with abnormal uterine bleeding and was noted to be pregnant with a positive urine pregnancy test.  The patient had serial beta hCGs which measured 394.5 on August 28, 2014, and 490 on August 30, 2014.  The patient's blood type was AB positive.  The patient was examined in the office on August 30, 2014, and she had no pain at that time.  The uterus appeared to be small and no adnexal masses were appreciated.  Concurrent pelvic ultrasound documented the absence of an intrauterine pregnancy and no evidence of an ectopic pregnancy.  The endometrial stripe measured 7.48 mm.  The  cervix was long and closed.  The right ovary contained 2 thick-walled cysts with echoes within and the left ovary contained a thin-walled cyst with septation.  There was a mild amount of clear fluid noted.  The patient was given a diagnosis of abnormally rising beta hCG levels and was given the diagnosis of a failing intrauterine pregnancy or possible ectopic pregnancy.  A plan was made to proceed the following day on August 31, 2014, with a dilation and evacuation with frozen section and a possible laparoscopy with salpingostomy or salpingectomy and removal of ectopic pregnancy.  Risks, benefits, and alternatives were reviewed with the patient who wished to proceed.  FINDINGS:  Exam under anesthesia revealed a small anteverted mobile uterus.  No adnexal masses were appreciated.  The dilation and evacuation revealed a very small amount of tissue from within the uterine cavity.  On frozen section, this was noted to be negative for chorionic villi.  Laparoscopy demonstrated a distal right fallopian tube ectopic pregnancy.  There was some gestational-type tissue, which was adherent to the patient's right ovary on the patient's right-hand side.  An implant of the right pelvic sidewall measuring 1 cm was later noted and completely removed.  This was suspicious for gestational tissue.  There was a small amount of blood noted in  the pelvis.  The fallopian tube was unruptured.  The right ovary contained two 3.5 cm cysts one of which appeared to be a corpus luteum cyst.  There was a 1-cm right hydatid cyst at the fallopian tube.  The left ovary contained a 4-cm simple cyst.  The uterus was unremarkable.  The appendix was unremarkable.  The liver and gallbladder were normal appearing.  There was no evidence of any adhesive disease in the abdomen or the pelvis.  SPECIMENS:  The endometrial tissue from the dilation and evacuation was sent to Pathology for frozen section and permanent  section.  The right ectopic pregnancy was sent to Pathology for permanent section. The right peritoneal sidewall biopsy was also sent for permanent sectioning to be performed separately.  DESCRIPTION OF PROCEDURE:  The patient was reidentified in the preoperative hold area.  She received Ancef 2 g IV for antibiotic prophylaxis.  She received PAS stockings for DVT prophylaxis.  In the operating room, the patient was placed in the supine position on the operating room table and a MAC anesthetic was administered.  The patient was placed in the dorsal lithotomy position.  The abdomen and vagina were then sterilely prepped and the patient was draped.  A Foley catheter was placed inside the bladder.  An exam under anesthesia was performed.  A speculum was placed inside the vagina and a single-tooth tenaculum was placed on the anterior cervical lip.  A paracervical block was performed with a total of 10 mL 1% lidocaine plain.  The uterus was sounded to 7 cm.  The cervix was then sequentially dilated with [facility] dilators.  A #6 suction tip curette was then introduced into the uterine cavity to the level of the fundus and withdrawn slightly.  It was then turned in a clockwise fashion and proper suction was applied and a flexible suction tip was removed from within the uterine cavity.  This was repeated 2 additional times.  Gentle sharp curettage was then performed.  There was a very small amount of tissue noted from within the uterine cavity. This was sent for frozen section.  Please refer to the findings above.  As there was no evidence of chorionic villi from within the uterine cavity, plans were made to proceed with a laparoscopy at this time. General endotracheal anesthesia was performed using the GlideScope. Please refer to the anesthesia report separately.  Attention was then turned to the abdomen where the infraumbilical region was injected locally with 0.25% Marcaine.  A 1-cm  infraumbilical incision was then created sharply with a scalpel and dissection down to the fascia was performed using a Kelly clamp.  A 10-mm trocar was then inserted directly into the peritoneal cavity.  The laparoscope confirmed proper placement.  A CO2 pneumoperitoneum was achieved, and the patient was then placed in the Trendelenburg position.  The ectopic pregnancy of the distal right fallopian tube was identified along with the other pathology.  Please refer to the findings as above.  A 5-mm incisions were created in each of the right and left lower quadrants after the skin was injected locally with 0.25% Marcaine.  The trocars were placed under direct visualization of the laparoscope.   Dilute vasopressin 10 units in 50 mL of normal saline was injected in the tubal serosa through a spinal needle introduced through the abdominal wall.  The fallopian tube was gently grasped with a Maryland grasper and the monopolar cautery tip on a Nezhat suction device was then used to create a linear  incision over the serosa of the fallopian tube.   Blunt dissection was used to dissect the ectopic pregnancy from the fallopian tube.  A 5-mm laparoscope was placed through the right lower quadrant incision and an EndoCatch bag was inserted through the umbilical trocar and the ectopic specimen was placed in the EndoCatch bag and removed from within the uterine cavity.  The 10-mm laparoscope was placed in the umbilical incision.  Any remaining implants of gestational tissue were removed from the ovarian serosa at this time.  The monopolar cautery on the Nezhat device was then used to open the serosa over each of the ovarian cysts.  The cysts were drained. Hemostasis of the serosa was achieved with a Kleppinger forceps in all locations.  The ureter along the right pelvic sidewall was examined and its course was noted as it moved.  The right pelvic sidewall was examined at this time and the implant  of what appeared to be the gestational tissue was removed with a grasper forceps at this time and sent to Pathology separately.  The pelvic sidewall was noted to be hemostatic.  All of the operative sites were then irrigated and suctioned and noted to be hemostatic.  The pneumoperitoneum was partially released and there was no evidence of bleeding.  At this time, Interceed was placed around each of the fallopian tubes bilaterally.  The lower abdominal trocars were removed under direct visualization of the laparoscope.  The pneumoperitoneum was released and the umbilical trocar was removed.  The umbilical incision was closed along the fascia with a figure-of- eight suture of 0 Vicryl.  The skin incisions were closed with subcuticular suture of 4-0 Vicryl.  Dermabond was placed over the incisions and all of the incisions were then covered sterilely.  The Foley catheter was removed from the bladder and the previously placed Hulka tenaculum was removed from the uterus.  The patient was cleansed with Betadine, she was extubated and sent to the recovery room in awake and stable condition.  There were no complications to the procedure.  All needle, instrument, and sponge counts were correct.     Lenard Galloway, M.D.     BES/MEDQ  D:  08/31/2014  T:  09/01/2014  Job:  [id number]

## 2014-09-03 ENCOUNTER — Telehealth: Payer: Self-pay | Admitting: Obstetrics and Gynecology

## 2014-09-03 ENCOUNTER — Ambulatory Visit (INDEPENDENT_AMBULATORY_CARE_PROVIDER_SITE_OTHER): Payer: 59 | Admitting: Obstetrics and Gynecology

## 2014-09-03 ENCOUNTER — Encounter: Payer: Self-pay | Admitting: Obstetrics and Gynecology

## 2014-09-03 VITALS — BP 130/60 | HR 76 | Ht 62.0 in | Wt 162.4 lb

## 2014-09-03 DIAGNOSIS — O009 Unspecified ectopic pregnancy without intrauterine pregnancy: Secondary | ICD-10-CM

## 2014-09-03 DIAGNOSIS — Z9889 Other specified postprocedural states: Secondary | ICD-10-CM

## 2014-09-03 NOTE — Telephone Encounter (Signed)
Patient has surgery 08/31/14 and was told to return today for a 3 day post post op with Dr.Silva. No appointment available today/

## 2014-09-03 NOTE — Telephone Encounter (Signed)
Patient to come today at 12:45 for a 1300 appointment with Dr. Quincy Simmonds.   Message left to return call to [place] at [phone].

## 2014-09-03 NOTE — Telephone Encounter (Signed)
Routing to provider for final review. Patient agreeable to disposition. Will close encounter.     

## 2014-09-03 NOTE — Telephone Encounter (Signed)
Patient returning call. I confirmed the time below will work for the patient and gave her the appointment. She will be here at 12:45.

## 2014-09-03 NOTE — Progress Notes (Signed)
Repeat HCG changed to 48 hours after lab was back.  Reviewed personally.  Felipa Emory, MD.

## 2014-09-03 NOTE — Progress Notes (Signed)
Patient ID: Nicole [name], female   DOB: 1981-01-18, 33 y.o.   MRN: [id number] GYNECOLOGY  VISIT   HPI: 33 y.o.   Married  Caucasian  female   G1P0000 with Patient's last menstrual period was 08/12/2014.   here for   Follow up. Status post dilation and evacuation with frozen section, laparoscopic right salpingostomy with removal of ectopic pregnancy, drainage of bilateral ovarian and right paratubal cysts, right peritoneal side wall biopsy.  Feeling good and happy with surgical outcome.  No problems.  Uncertain when she wants to proceed with future pregnancy.  GYNECOLOGIC HISTORY: Patient's last menstrual period was 08/12/2014. Contraception:   none Menopausal hormone therapy: none        OB History   Grav Para Term Preterm Abortions TAB SAB Ect Mult Living   1 0 0 0 0 0 0 0 0 0          There are no active problems to display for this patient.   Past Medical History  Diagnosis Date  . Asthma   . Hematuria     negative workup with urology    Past Surgical History  Procedure Laterality Date  . Tympanostomy tube placement      Current Outpatient Prescriptions  Medication Sig Dispense Refill  . albuterol (PROVENTIL HFA;VENTOLIN HFA) 108 (90 BASE) MCG/ACT inhaler Inhale 1 puff into the lungs every 6 (six) hours as needed for wheezing or shortness of breath.      . cetirizine (ZYRTEC) 10 MG tablet Take 10 mg by mouth daily.      . naproxen sodium (ANAPROX) 550 MG tablet Take 1 tablet (550 mg total) by mouth 2 (two) times daily with a meal.  30 tablet  1  . oxyCODONE-acetaminophen (PERCOCET) 5-325 MG per tablet Take 2 tablets by mouth every 4 (four) hours as needed. use only as much as needed to relieve pain  30 tablet  0  . Prenatal Vit-Fe Fumarate-FA (PRENATAL MULTIVITAMIN) TABS tablet Take 1 tablet by mouth daily at 12 noon.       No current facility-administered medications for this visit.     ALLERGIES: Review of patient's allergies indicates no known allergies.  Family  History  Problem Relation Age of Onset  . Hyperlipidemia Father   . Hypertension Father   . Diabetes Father   . Diabetes Paternal Grandfather   . Hypertension Paternal Grandfather     History   Social History  . Marital Status: Married    Spouse Name: N/A    Number of Children: N/A  . Years of Education: N/A   Occupational History  . Not on file.   Social History Main Topics  . Smoking status: Former Research scientist (life sciences)  . Smokeless tobacco: Not on file  . Alcohol Use: 2.0 - 2.5 oz/week    4-5 drink(s) per week  . Drug Use: No  . Sexual Activity: Yes    Partners: Male   Other Topics Concern  . Not on file   Social History Narrative  . No narrative on file    ROS:  Pertinent items are noted in HPI.  PHYSICAL EXAMINATION:    BP 130/60  Pulse 76  Ht 5\' 2"  (1.575 m)  Wt 162 lb 6.4 oz (73.664 kg)  BMI 29.70 kg/m2  LMP 08/12/2014     General appearance: alert, cooperative and appears stated age   Abdomen: incisions intact, soft, non-tender; no masses,  no organomegaly  ASSESSMENT  Doing well post op.   PLAN  Counseled on findings and surgical procedure.  Questions answered.  Discussed future risk of ectopic pregnancy and need to wait for 6 months for tube to heal prior to trying for another pregnancy.  Check quant beta HCG today and weekly and follow until negative.  Plan for post op check in 2 weeks.     An After Visit Summary was printed and given to the patient.  ___15___ minutes face to face time of which over 50% was spent in counseling.

## 2014-09-04 ENCOUNTER — Telehealth: Payer: Self-pay

## 2014-09-04 LAB — HCG, QUANTITATIVE, PREGNANCY: hCG, Beta Chain, Quant, S: 56.3 m[IU]/mL

## 2014-09-04 NOTE — Telephone Encounter (Signed)
Message copied by Jasmine Awe on Tue Sep 04, 2014  4:25 PM ------      Message from: [place]      Created: Tue Sep 04, 2014 12:50 PM       Please let the patient know that the hcg has fallen exactly in the amount expected since her surgery for her right ectopic pregnancy.             She should have an appointment for a next lab visit in one week to measure it again.             I have sent her a My Chart message about this as well.            CC - Marisa Sprinkles ------

## 2014-09-04 NOTE — Telephone Encounter (Signed)
Spoke with patient. Results and message given as seen below from Dr.Silva. Patient is agreeable and verbalizes understanding.  Routing to provider for final review. Patient agreeable to disposition. Will close encounter   

## 2014-09-05 ENCOUNTER — Encounter: Payer: Self-pay | Admitting: Obstetrics and Gynecology

## 2014-09-05 DIAGNOSIS — O009 Unspecified ectopic pregnancy without intrauterine pregnancy: Secondary | ICD-10-CM | POA: Insufficient documentation

## 2014-09-10 ENCOUNTER — Other Ambulatory Visit (INDEPENDENT_AMBULATORY_CARE_PROVIDER_SITE_OTHER): Payer: 59

## 2014-09-10 DIAGNOSIS — O009 Unspecified ectopic pregnancy without intrauterine pregnancy: Secondary | ICD-10-CM

## 2014-09-12 LAB — HCG, QUANTITATIVE, PREGNANCY: hCG, Beta Chain, Quant, S: 4.3 m[IU]/mL

## 2014-09-13 ENCOUNTER — Other Ambulatory Visit: Payer: Self-pay | Admitting: Obstetrics and Gynecology

## 2014-09-13 ENCOUNTER — Telehealth: Payer: Self-pay | Admitting: Obstetrics and Gynecology

## 2014-09-13 DIAGNOSIS — O009 Unspecified ectopic pregnancy without intrauterine pregnancy: Secondary | ICD-10-CM

## 2014-09-13 NOTE — Telephone Encounter (Signed)
Dr.Silva, please review and advise lab results from 9/28.

## 2014-09-13 NOTE — Telephone Encounter (Signed)
Quantitative HCG is now down to 4.3. This is exactly where I expected it to be.  Patient needs another lab visit in a week.  If she is coming next week for her next post op visit, I will do it then.

## 2014-09-13 NOTE — Telephone Encounter (Signed)
Patient is calling for recent lab results °

## 2014-09-14 NOTE — Telephone Encounter (Signed)
Spoke with patient. Advised of message as seen below from Dr.Silva. Patient is agreeable and verbalizes understanding.  Routing to provider for final review. Patient agreeable to disposition. Will close encounter.  

## 2014-09-17 ENCOUNTER — Encounter: Payer: Self-pay | Admitting: Obstetrics and Gynecology

## 2014-09-17 ENCOUNTER — Ambulatory Visit (INDEPENDENT_AMBULATORY_CARE_PROVIDER_SITE_OTHER): Payer: 59 | Admitting: Obstetrics and Gynecology

## 2014-09-17 VITALS — BP 110/70 | HR 88 | Ht 62.0 in | Wt 158.6 lb

## 2014-09-17 DIAGNOSIS — O009 Unspecified ectopic pregnancy without intrauterine pregnancy: Secondary | ICD-10-CM

## 2014-09-17 MED ORDER — NORETHINDRONE ACET-ETHINYL EST 1.5-30 MG-MCG PO TABS: 1.0000 | ORAL_TABLET | Freq: Every day | ORAL | Status: DC

## 2014-09-17 NOTE — Addendum Note (Signed)
Addended by: Blanchard Kelch R on: 09/17/2014 01:51 PM   Modules accepted: Orders

## 2014-09-17 NOTE — Progress Notes (Signed)
Patient ID: Nicole [name], female   DOB: 03-Mar-1981, 33 y.o.   MRN: [id number] GYNECOLOGY  VISIT   HPI: 33 y.o.   Married  Caucasian  female   G1P0000 with Patient's last menstrual period was 09/05/2014.   here for   Follow up. Patient on short term disability until October 20th.  Needs one final HCG level. Took LoEstrin 1.5/30 and Yasmin in the past and did well with this.   Patient very complimentary about our staff at the office.  GYNECOLOGIC HISTORY: Patient's last menstrual period was 09/05/2014. Contraception: abstinence.   Menopausal hormone therapy: n/a        OB History   Grav Para Term Preterm Abortions TAB SAB Ect Mult Living   1 0 0 0 0 0 0 0 0 0          Patient Active Problem List   Diagnosis Date Noted  . Ectopic pregnancy 09/05/2014    Past Medical History  Diagnosis Date  . Asthma   . Hematuria     negative workup with urology    Past Surgical History  Procedure Laterality Date  . Tympanostomy tube placement    . Dilation and evacuation N/A 08/31/2014    Procedure: DILATATION AND EVACUATION, FROZEN SECTION;  Surgeon: Everardo All Amundson de Berton Lan, MD;  Location: [facility];  Service: Gynecology;  Laterality: N/A;  . Laparoscopy N/A 08/31/2014    Procedure:  LAPAROSCOPY OPERATIVE  right salpinostomy ;  Surgeon: Jamey Reas de Berton Lan, MD;  Location: [facility];  Service: Gynecology;  Laterality: N/A;  . Laparoscopic ovarian cystectomy Right 08/31/2014    Procedure: LAPAROSCOPIC bilateral  OVARIAN CYSTs drainage  drainage of right  tubal cyst;  Surgeon: Jamey Reas de Berton Lan, MD;  Location: [facility];  Service: Gynecology;  Laterality: Right;  . Esophageal biopsy Right 08/31/2014    Procedure: BIOPSY right peritoneal side wall ;  Surgeon: Jamey Reas de Berton Lan, MD;  Location: [facility];  Service: Gynecology;  Laterality: Right;  . Ectopic pregnancy surgery Right 2015    Current Outpatient Prescriptions  Medication Sig  Dispense Refill  . albuterol (PROVENTIL HFA;VENTOLIN HFA) 108 (90 BASE) MCG/ACT inhaler Inhale 1 puff into the lungs every 6 (six) hours as needed for wheezing or shortness of breath.      . cetirizine (ZYRTEC) 10 MG tablet Take 10 mg by mouth daily.      . Prenatal Vit-Fe Fumarate-FA (PRENATAL MULTIVITAMIN) TABS tablet Take 1 tablet by mouth daily at 12 noon.       No current facility-administered medications for this visit.     ALLERGIES: Review of patient's allergies indicates no known allergies.  Family History  Problem Relation Age of Onset  . Hyperlipidemia Father   . Hypertension Father   . Diabetes Father   . Diabetes Paternal Grandfather   . Hypertension Paternal Grandfather     History   Social History  . Marital Status: Married    Spouse Name: N/A    Number of Children: N/A  . Years of Education: N/A   Occupational History  . Not on file.   Social History Main Topics  . Smoking status: Former Research scientist (life sciences)  . Smokeless tobacco: Not on file  . Alcohol Use: 2.0 - 2.5 oz/week    4-5 drink(s) per week  . Drug Use: No  . Sexual Activity: Yes    Partners: Male    Birth Control/ Protection: Abstinence   Other  Topics Concern  . Not on file   Social History Narrative  . No narrative on file    ROS:  Pertinent items are noted in HPI.  PHYSICAL EXAMINATION:    BP 110/70  Pulse 88  Ht 5\' 2"  (1.575 m)  Wt 158 lb 9.6 oz (71.94 kg)  BMI 29.00 kg/m2  LMP 09/05/2014  Breastfeeding? Unknown     General appearance: alert, cooperative and appears stated age   Abdomen: incisions, intact, soft, non-tender; no masses,  no organomegaly  Pelvic: External genitalia:  no lesions              Urethra:  normal appearing urethra with no masses, tenderness or lesions              Bartholins and Skenes: normal                 Vagina: normal appearing vagina with normal color and discharge, no lesions              Cervix: normal appearance                   Bimanual Exam:   Uterus:  uterus is normal size, shape, consistency and nontender                                      Adnexa: normal adnexa in size, nontender and no masses                                 ASSESSMENT  Right ectopic pregnancy.  Status post right salpingostomy and drainage of ovarian and right paratubal cyst.   PLAN  Check hopefully final quantitative beta HCG.  Discussed no heavy lifting or exercise for 4 weeks post op. Discussed risk of future ectopic pregnancy and need for good planing of pregnancy.  Will avoid pregnancy for at least 6 months.  Rx LoEstrin 1.5/30 for 3 months.  Annual exam is due in December 2015 with Evalee Mutton.  She will keep this appointment.    An After Visit Summary was printed and given to the patient.  __15____ minutes face to face time of which over 50% was spent in counseling.

## 2014-09-18 LAB — HCG, QUANTITATIVE, PREGNANCY: hCG, Beta Chain, Quant, S: <2 m[IU]/mL

## 2014-09-25 ENCOUNTER — Telehealth: Payer: Self-pay | Admitting: Obstetrics and Gynecology

## 2014-09-25 NOTE — Telephone Encounter (Signed)
Representative from [employer] to confirm the patient has a 15 pound lifting restriction. It was not documented in the forms.

## 2014-09-25 NOTE — Telephone Encounter (Signed)
Will you advise if the patient has a 15 pound lifting restriction at this time?

## 2014-09-25 NOTE — Telephone Encounter (Signed)
Patient cannot lift over 15 pounds for 4 weeks following surgery.

## 2014-09-26 NOTE — Telephone Encounter (Signed)
[place] to update/LVMTCB if any additional questions regarding patient care.

## 2014-09-28 ENCOUNTER — Encounter: Payer: Self-pay | Admitting: *Deleted

## 2014-10-02 ENCOUNTER — Telehealth: Payer: Self-pay | Admitting: *Deleted

## 2014-10-02 ENCOUNTER — Encounter: Payer: Self-pay | Admitting: *Deleted

## 2014-10-02 NOTE — Telephone Encounter (Signed)
Patient came into office requesting return to work note from Dr Quincy Simmonds for surgical procedure from 08-31-14. She is a Marine scientist at [employer] and since hospital was unable to accommodate her lifting restrictions, she was out of work for four weeks. Note given to patient stating patient was under our care for surgical procedure since 08-31-14 that required physical activity and lifting restrictions for four weeks, released to return to work on 09-28-14. Patient's was not scheduled to work this week until 10-03-14. See note under letter tab.  Routing to provider for final review. Patient agreeable to disposition. Will close encounter

## 2014-10-15 ENCOUNTER — Encounter: Payer: Self-pay | Admitting: Obstetrics and Gynecology

## 2014-11-29 ENCOUNTER — Encounter: Payer: Self-pay | Admitting: Certified Nurse Midwife

## 2014-11-29 ENCOUNTER — Ambulatory Visit (INDEPENDENT_AMBULATORY_CARE_PROVIDER_SITE_OTHER): Payer: 59 | Admitting: Certified Nurse Midwife

## 2014-11-29 VITALS — BP 90/60 | HR 64 | Resp 16 | Ht 62.0 in | Wt 157.0 lb

## 2014-11-29 DIAGNOSIS — Z3041 Encounter for surveillance of contraceptive pills: Secondary | ICD-10-CM

## 2014-11-29 DIAGNOSIS — Z01419 Encounter for gynecological examination (general) (routine) without abnormal findings: Secondary | ICD-10-CM

## 2014-11-29 MED ORDER — NORETHINDRONE ACET-ETHINYL EST 1.5-30 MG-MCG PO TABS: 1.0000 | ORAL_TABLET | Freq: Every day | ORAL | Status: DC

## 2014-11-29 NOTE — Patient Instructions (Signed)

## 2014-11-29 NOTE — Progress Notes (Signed)
33 y.o. G35P0000 Married Caucasian Fe here for annual exam. Periods scant to none for 2 days duration. Emotionally OK since tubal pregnancy. Anxious about when she will try again. Working on weight loss since tubal pregnancy and surgery. Busy with work. No health issues today.   Patient's last menstrual period was 11/24/2014.          Sexually active: Yes.    The current method of family planning is OCP (estrogen/progesterone).    Exercising: Yes.    walking & eliptical Smoker:  no  Health Maintenance: Pap:  11-23-13 neg HPV HR neg MMG:  none Colonoscopy:  none BMD:   none TDaP: 2008 Labs: none Self breast exam: done monthly   reports that she has quit smoking. She does not have any smokeless tobacco history on file. She reports that she drinks about 1.8 - 2.4 oz of alcohol per week. She reports that she does not use illicit drugs.  Past Medical History  Diagnosis Date  . Asthma   . Hematuria     negative workup with urology    Past Surgical History  Procedure Laterality Date  . Tympanostomy tube placement    . Dilation and evacuation N/A 08/31/2014    Procedure: DILATATION AND EVACUATION, FROZEN SECTION;  Surgeon: Everardo All Amundson de Berton Lan, MD;  Location: [facility];  Service: Gynecology;  Laterality: N/A;  . Laparoscopy N/A 08/31/2014    Procedure:  LAPAROSCOPY OPERATIVE  right salpinostomy ;  Surgeon: Jamey Reas de Berton Lan, MD;  Location: [facility];  Service: Gynecology;  Laterality: N/A;  . Laparoscopic ovarian cystectomy Right 08/31/2014    Procedure: LAPAROSCOPIC bilateral  OVARIAN CYSTs drainage  drainage of right  tubal cyst;  Surgeon: Jamey Reas de Berton Lan, MD;  Location: [facility];  Service: Gynecology;  Laterality: Right;  . Esophageal biopsy Right 08/31/2014    Procedure: BIOPSY right peritoneal side wall ;  Surgeon: Jamey Reas de Berton Lan, MD;  Location: [facility];  Service: Gynecology;  Laterality: Right;  . Ectopic pregnancy  surgery Right 2015    Current Outpatient Prescriptions  Medication Sig Dispense Refill  . albuterol (PROVENTIL HFA;VENTOLIN HFA) 108 (90 BASE) MCG/ACT inhaler Inhale 1 puff into the lungs every 6 (six) hours as needed for wheezing or shortness of breath.    . cetirizine (ZYRTEC) 10 MG tablet Take 10 mg by mouth daily.    . naproxen sodium (ANAPROX) 220 MG tablet Take 220 mg by mouth as needed.    . Norethindrone Acetate-Ethinyl Estradiol (LOESTRIN 1.5/30, 21,) 1.5-30 MG-MCG tablet Take 1 tablet by mouth daily. 3 Package 0   No current facility-administered medications for this visit.    Family History  Problem Relation Age of Onset  . Hyperlipidemia Father   . Hypertension Father   . Diabetes Father   . Diabetes Paternal Grandfather   . Hypertension Paternal Grandfather     ROS:  Pertinent items are noted in HPI.  Otherwise, a comprehensive ROS was negative.  Exam:   BP 90/60 mmHg  Pulse 64  Resp 16  Ht 5\' 2"  (1.575 m)  Wt 157 lb (71.215 kg)  BMI 28.71 kg/m2  LMP 11/24/2014 Height: 5\' 2"  (157.5 cm)  Ht Readings from Last 3 Encounters:  11/29/14 5\' 2"  (1.575 m)  09/17/14 5\' 2"  (1.575 m)  09/03/14 5\' 2"  (1.575 m)    General appearance: alert, cooperative and appears stated age Head: Normocephalic, without obvious abnormality, atraumatic Neck:  no adenopathy, supple, symmetrical, trachea midline and thyroid normal to inspection and palpation Lungs: clear to auscultation bilaterally Breasts: normal appearance, no masses or tenderness, No nipple retraction or dimpling, No nipple discharge or bleeding, No axillary or supraclavicular adenopathy Heart: regular rate and rhythm Abdomen: soft, non-tender; no masses,  no organomegaly Extremities: extremities normal, atraumatic, no cyanosis or edema Skin: Skin color, texture, turgor normal. No rashes or lesions Lymph nodes: Cervical, supraclavicular, and axillary nodes normal. No abnormal inguinal nodes palpated Neurologic: Grossly  normal   Pelvic: External genitalia:  no lesions              Urethra:  normal appearing urethra with no masses, tenderness or lesions              Bartholin's and Skene's: normal                 Vagina: normal appearing vagina with normal color and discharge, no lesions              Cervix: normal, non tender, no lesions              Pap taken: No. Bimanual Exam:  Uterus:  normal size, contour, position, consistency, mobility, non-tender and mid position              Adnexa: normal adnexa and no mass, fullness, tenderness               Rectovaginal: Confirms               Anus:  normal sphincter tone, no lesions  A:  Well Woman with normal exam  Contraception OCP desired  P:   Reviewed health and wellness pertinent to exam  Rx Loestrin 1.5/30  See order  Pap smear not taken today  Questions addressed regarding future pregnancies and time for trying. Discussed with patient encourage waiting one year and she agrees that is what she feels is needed.   counseled on breast self exam, use and side effects of OCP's, adequate intake of calcium and vitamin D, diet and exercise  return annually or prn  An After Visit Summary was printed and given to the patient.

## 2014-11-30 NOTE — Progress Notes (Signed)
Reviewed personally.  M. Suzanne Miller, MD.  

## 2015-01-28 ENCOUNTER — Telehealth: Payer: Self-pay | Admitting: Certified Nurse Midwife

## 2015-01-28 NOTE — Telephone Encounter (Signed)
Spoke with patient. She has hx of ectopic pregnancy 08/31/14 D&C. On Loestrin 1.5/30 since 09/17/14. Patient states that periods have been scant/light since starting Loestrin on 10/15. States that Months 1-4 were light and last month she had no period at all. Took last placebo pill on Thursday 01/24/14 and took a urine pregnancy test which was negative. Then began new pack of pills on Friday. Patient states she had one missed pill last month but was only a few hours late and took it on the same day. Patient denies pain or spotting. Advised patient to continue to take pills as directed, ensure no missed pills and call back if heavy cycle develops. Patient states she has another pregnancy test and she will take another pregnancy test next week, "just to be sure." Advised patient okay to do so. Advised patient to continue to keep log of menses and missed pills. Patient agreeable to plan.   Advised would request Dr. Quincy Simmonds review message and would return call if any new instructions.

## 2015-01-28 NOTE — Telephone Encounter (Signed)
Patient calling with questions about a missed cycle.

## 2015-01-28 NOTE — Telephone Encounter (Signed)
I agree with repeating the pregnancy test next week if no menses.  If patient continues to skip cycles and would like to have a period every month, we can switch her combined OCP. If she is happy not having cycles, do periodic UPT.

## 2015-01-29 NOTE — Telephone Encounter (Signed)
Patient returned call and message from Dr. Quincy Simmonds given. She will take pregnancy test next week and return call if she would like to change type of oral contraception.  Routing to provider for final review. Patient agreeable to disposition. Will close encounter

## 2015-01-29 NOTE — Telephone Encounter (Signed)
Message left to return call to Tracy at [phone].    

## 2015-11-01 ENCOUNTER — Other Ambulatory Visit: Payer: Self-pay | Admitting: Obstetrics and Gynecology

## 2015-11-01 NOTE — Telephone Encounter (Signed)
Medication refill request: Naproxen  Last AEX:  11-29-2014 DL Next AEX: 12-18-2015 DL Last MMG (if hormonal medication request): None Refill authorized: Please advise

## 2015-12-18 ENCOUNTER — Ambulatory Visit: Payer: 59 | Admitting: Certified Nurse Midwife

## 2016-01-02 ENCOUNTER — Ambulatory Visit (INDEPENDENT_AMBULATORY_CARE_PROVIDER_SITE_OTHER): Payer: 59 | Admitting: Certified Nurse Midwife

## 2016-01-02 ENCOUNTER — Encounter: Payer: Self-pay | Admitting: Certified Nurse Midwife

## 2016-01-02 VITALS — BP 118/78 | HR 74 | Resp 17 | Ht 62.25 in | Wt 163.0 lb

## 2016-01-02 DIAGNOSIS — Z124 Encounter for screening for malignant neoplasm of cervix: Secondary | ICD-10-CM

## 2016-01-02 DIAGNOSIS — Z01419 Encounter for gynecological examination (general) (routine) without abnormal findings: Secondary | ICD-10-CM

## 2016-01-02 DIAGNOSIS — Z0000 Encounter for general adult medical examination without abnormal findings: Secondary | ICD-10-CM | POA: Diagnosis not present

## 2016-01-02 LAB — POCT URINALYSIS DIPSTICK
Bilirubin, UA: NEGATIVE
Glucose, UA: NEGATIVE
Ketones, UA: NEGATIVE
Leukocytes, UA: NEGATIVE
Nitrite, UA: NEGATIVE
Protein, UA: NEGATIVE
Urobilinogen, UA: NEGATIVE
pH, UA: 5

## 2016-01-02 NOTE — Progress Notes (Signed)
35 y.o. G70P0000 Married  Caucasian Fe here for annual exam. Periods normal, no issues. Contraception OCP has been working well. Does not want to renew. Plans to just be without hormones and see if pregnancy occurs. Working on weight loss with healthy choices. Exercising ! Will start on folic acid. Applied for doctorate program and passed some of the entrance exams!! Sees PCP for aex and labs. No health concerns today.  Patient's last menstrual period was 12/11/2015.          Sexually active: Yes.    The current method of family planning is OCP (estrogen/progesterone).    Exercising: Yes.    eliptical, walking Smoker:  no  Health Maintenance: Pap:  11-23-13 neg HPV HR neg MMG:  none Colonoscopy:  none BMD:   none TDaP:  2008 Shingles: no Pneumonia: no Hep C and HIV: not done Labs: poct urine-rbc tr- neg workup at urology Self breast exam: done occ   reports that she has quit smoking. She does not have any smokeless tobacco history on file. She reports that she drinks about 1.8 - 2.4 oz of alcohol per week. She reports that she does not use illicit drugs.  Past Medical History  Diagnosis Date  . Asthma   . Hematuria     negative workup with urology    Past Surgical History  Procedure Laterality Date  . Tympanostomy tube placement    . Dilation and evacuation N/A 08/31/2014    Procedure: DILATATION AND EVACUATION, FROZEN SECTION;  Surgeon: Everardo All Amundson de Berton Lan, MD;  Location: [facility];  Service: Gynecology;  Laterality: N/A;  . Laparoscopy N/A 08/31/2014    Procedure:  LAPAROSCOPY OPERATIVE  right salpinostomy ;  Surgeon: Jamey Reas de Berton Lan, MD;  Location: [facility];  Service: Gynecology;  Laterality: N/A;  . Laparoscopic ovarian cystectomy Right 08/31/2014    Procedure: LAPAROSCOPIC bilateral  OVARIAN CYSTs drainage  drainage of right  tubal cyst;  Surgeon: Jamey Reas de Berton Lan, MD;  Location: [facility];  Service: Gynecology;  Laterality:  Right;  . Esophageal biopsy Right 08/31/2014    Procedure: BIOPSY right peritoneal side wall ;  Surgeon: Jamey Reas de Berton Lan, MD;  Location: [facility];  Service: Gynecology;  Laterality: Right;  . Ectopic pregnancy surgery Right 2015    Current Outpatient Prescriptions  Medication Sig Dispense Refill  . albuterol (PROVENTIL HFA;VENTOLIN HFA) 108 (90 BASE) MCG/ACT inhaler Inhale 1 puff into the lungs every 6 (six) hours as needed for wheezing or shortness of breath.    . cetirizine (ZYRTEC) 10 MG tablet Take 10 mg by mouth daily.    . naproxen sodium (ANAPROX) 550 MG tablet TAKE 1 TABLET BY MOUTH TWICE A DAY WITH A MEAL (Patient taking differently: prn) 30 tablet 0  . Norethindrone Acetate-Ethinyl Estradiol (LOESTRIN 1.5/30, 21,) 1.5-30 MG-MCG tablet Take 1 tablet by mouth daily. 3 Package 4  . Probiotic Product (PROBIOTIC PO) Take by mouth.     No current facility-administered medications for this visit.    Family History  Problem Relation Age of Onset  . Hyperlipidemia Father   . Hypertension Father   . Diabetes Father   . Diabetes Paternal Grandfather   . Hypertension Paternal Grandfather     ROS:  Pertinent items are noted in HPI.  Otherwise, a comprehensive ROS was negative.  Exam:   BP 118/78 mmHg  Pulse 74  Resp 17  Ht 5' 2.25" (1.581 m)  Wt 163 lb (73.936 kg)  BMI 29.58 kg/m2  LMP 12/11/2015 Height: 5' 2.25" (158.1 cm) Ht Readings from Last 3 Encounters:  01/02/16 5' 2.25" (1.581 m)  11/29/14 5\' 2"  (1.575 m)  09/17/14 5\' 2"  (1.575 m)    General appearance: alert, cooperative and appears stated age Head: Normocephalic, without obvious abnormality, atraumatic Neck: no adenopathy, supple, symmetrical, trachea midline and thyroid normal to inspection and palpation Lungs: clear to auscultation bilaterally Breasts: normal appearance, no masses or tenderness, No nipple retraction or dimpling, No nipple discharge or bleeding, No axillary or supraclavicular  adenopathy Heart: regular rate and rhythm Abdomen: soft, non-tender; no masses,  no organomegaly Extremities: extremities normal, atraumatic, no cyanosis or edema Skin: Skin color, texture, turgor normal. No rashes or lesions Lymph nodes: Cervical, supraclavicular, and axillary nodes normal. No abnormal inguinal nodes palpated Neurologic: Grossly normal   Pelvic: External genitalia:  no lesions              Urethra:  normal appearing urethra with no masses, tenderness or lesions              Bartholin's and Skene's: normal                 Vagina: normal appearing vagina with normal color and discharge, no lesions              Cervix: normal non tender, no lesions              Pap taken: Yes.   Bimanual Exam:  Uterus:  normal size, contour, position, consistency, mobility, non-tender              Adnexa: normal adnexa and no mass, fullness, tenderness               Rectovaginal: Confirms               Anus:  normal sphincter tone, no lesions  Chaperone present: yes  A:  Well Woman with normal exam  Contraception previous OCP, plans none at this point, not trying for pregnancy    P:   Reviewed health and wellness pertinent to exam  Will advise if desires OCP again . Discussed either prenatal vitamins daily or Folic acid 1 mg daily.  Discussed if missed period needs to do UPT and advise due to history of ectopic pregnancy. Patient voiced understanding.  Pap smear as above taken with HPV reflex.   counseled on breast self exam, adequate intake of calcium and vitamin D, diet and exercise  return annually or prn  An After Visit Summary was printed and given to the patient.

## 2016-01-02 NOTE — Patient Instructions (Signed)

## 2016-01-06 LAB — IPS PAP TEST WITH REFLEX TO HPV

## 2016-01-10 NOTE — Progress Notes (Signed)
Reviewed personally.  M. Suzanne Miller, MD.  

## 2016-02-12 MED FILL — NAPROXEN SODIUM 550 MG TAB: 550 | 30 days supply | Qty: 60 | Fill #1

## 2016-05-07 DIAGNOSIS — H5213 Myopia, bilateral: Secondary | ICD-10-CM | POA: Diagnosis not present

## 2016-05-07 DIAGNOSIS — H52221 Regular astigmatism, right eye: Secondary | ICD-10-CM | POA: Diagnosis not present

## 2016-05-14 DIAGNOSIS — J45990 Exercise induced bronchospasm: Secondary | ICD-10-CM | POA: Diagnosis not present

## 2016-05-14 DIAGNOSIS — Z0000 Encounter for general adult medical examination without abnormal findings: Secondary | ICD-10-CM | POA: Diagnosis not present

## 2016-05-14 DIAGNOSIS — L259 Unspecified contact dermatitis, unspecified cause: Secondary | ICD-10-CM | POA: Diagnosis not present

## 2016-07-03 DIAGNOSIS — T300 Burn of unspecified body region, unspecified degree: Secondary | ICD-10-CM | POA: Diagnosis not present

## 2016-07-03 DIAGNOSIS — T2107XA Burn of unspecified degree of female genital region, initial encounter: Secondary | ICD-10-CM | POA: Diagnosis not present

## 2016-07-03 MED FILL — SILVADENE 1% CREAM: 1 | 10 days supply | Qty: 50 | Fill #0

## 2016-10-19 ENCOUNTER — Telehealth: Payer: Self-pay | Admitting: Certified Nurse Midwife

## 2016-10-19 NOTE — Telephone Encounter (Signed)
Patient is scheduled for 10/20/16 at 0800 with Melvia Heaps, CNM.   Will close encounter.  Cc: Melvia Heaps, CNM

## 2016-10-19 NOTE — Telephone Encounter (Signed)
Forwarding for FYI and any additional recommendations?  Cc: Melvia Heaps, CNM

## 2016-10-19 NOTE — Telephone Encounter (Signed)
I just saw that the patient wants to see Debbie tomorrow.  Ok to schedule this with her for tomorrow.

## 2016-10-19 NOTE — Telephone Encounter (Signed)
I would make an appointment for patient to see Evalee Mutton in two weeks.

## 2016-10-19 NOTE — Telephone Encounter (Signed)
Patient called and scheduled an appointment for a "birth control consult" with Melvia Heaps, CNM for 10/20/16. She said, "I took a positive pregnancy test yesterday. But this is not a good time for me so I am scheduled for a procedure with a clinic this Thursday. I'd like to come in and see Debbi tomorrow to talk with her about birth control. 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 FYI only--patient did not request a call back.

## 2016-10-20 ENCOUNTER — Encounter: Payer: Self-pay | Admitting: Certified Nurse Midwife

## 2016-10-20 ENCOUNTER — Ambulatory Visit (INDEPENDENT_AMBULATORY_CARE_PROVIDER_SITE_OTHER): Payer: 59 | Admitting: Certified Nurse Midwife

## 2016-10-20 VITALS — BP 120/70 | HR 70 | Resp 16 | Ht 62.25 in | Wt 165.0 lb

## 2016-10-20 DIAGNOSIS — Z3201 Encounter for pregnancy test, result positive: Secondary | ICD-10-CM

## 2016-10-20 DIAGNOSIS — N912 Amenorrhea, unspecified: Secondary | ICD-10-CM | POA: Diagnosis not present

## 2016-10-20 LAB — POCT URINE PREGNANCY: Preg Test, Ur: POSITIVE — AB

## 2016-10-20 MED FILL — VENTOLIN HFA 90 MCG INHALER: 108 (90 BAS | 16 days supply | Qty: 18 | Fill #0

## 2016-10-20 NOTE — Progress Notes (Signed)
35 y.o. married white female g1p0010 presents with amenorrhea with + UPT on 10/16/16. LMP 09/13/16. Not planned pregnancy and not good timing. Contraception consistent condom use. 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 Patient does not want to continue pregnancy. She has been accepted in FNP program and feels this is not a good time in her life. Previous pregnancy was tubal with surgical intervention.  Complaining of breast tenderness, Denies spotting, bleeding or cramping. Medications she is taking are probiotic.Marland KitchenPatient has had alcohol since + UPT. Spouse supportive of her choice to not continue pregnancy. Patient has already scheduled appointment with [facility], but wants to be OCP again when released.    O: HPI pertinent to above. Healthy [employer] female Affect: normal, orientation x 3  Last Aex: ABO Rh: AB +             Rubella screen:positive  A: Amenorrhea with positive UPT  5 wk 3 days per LNMP with [facility]  06/20/17 Unplanned pregnancy with plans to not continue pregnancy   P: Reviewed with patient importance of making sure she gives her OB history with tubal with her upcoming appointment at [facility]. She has PUS scheduled at that appointment. Will take copy of blood type RH with her from her records. Patient will advise when she has had her follow up appointment, so she can restart her OCP. Questions addressed. Discussed with patient that she review risks/benefits of procedure and her options to continue the pregnancy or not. She plans to discuss again with spouse before her appointment.     Rv prn   25 minutes of time spent with patient in face to face counseling regarding pregnancy and her options to continue pregnancy or not.

## 2016-10-21 NOTE — Progress Notes (Signed)
Encounter reviewed Jill Jertson, MD   

## 2016-10-26 MED FILL — NAPROXEN SODIUM 550 MG TAB: 550 | 30 days supply | Qty: 60 | Fill #2

## 2016-10-28 ENCOUNTER — Telehealth: Payer: Self-pay | Admitting: Certified Nurse Midwife

## 2016-10-28 NOTE — Telephone Encounter (Signed)
Patient states she saw Debbi last Tuesday.  Says the procedure went well and she was told to call our office and Debbi would call in birth control for her.

## 2016-10-28 NOTE — Telephone Encounter (Signed)
Melvia Heaps, CNM -see below and advise.

## 2016-10-28 NOTE — Telephone Encounter (Signed)
Left message to call Jill at [phone].  

## 2016-10-28 NOTE — Telephone Encounter (Signed)
Has she been in for her follow up after procedure? Will need to make sure all is stable and OK for OCP use

## 2016-10-29 MED ORDER — NORETHIN ACE-ETH ESTRAD-FE 1-20 MG-MCG(24) PO TABS: 1.0000 | ORAL_TABLET | Freq: Every day | ORAL | 0 refills | Status: DC

## 2016-10-29 MED FILL — BLISOVI 24 FE TABLET: 1-20 | 84 days supply | Qty: 84 | Fill #0

## 2016-10-29 MED FILL — CLOBETASOL 0.05% CREAM: 0.05 | 30 days supply | Qty: 30 | Fill #0

## 2016-10-29 NOTE — Telephone Encounter (Signed)
Ok to do Loestrin 24 fe Rx to start after her exam. Will need 3 month BP check after starting

## 2016-10-29 NOTE — Telephone Encounter (Signed)
Left message to call Jill at [phone].  

## 2016-10-29 NOTE — Telephone Encounter (Signed)
Spoke with patient. Patient states her follow-up is 11/28 at the clinic. Patient states clinic recommended starting OCP 11/19. Patient states clinic only gives out orthotrycycline and she did not want to be started on that OCP. Patient states she prefers the LoEstrin she had in past. Patient states her bleeding is normal and reports she feels fine otherwise. Advised I would forward information to Melvia Heaps, CNM and return call with recommendations. Patient is agreeable.  Melvia Heaps, CNM please advise?

## 2016-10-29 NOTE — Telephone Encounter (Signed)
Spoke with patient, advised as seen below per Melvia Heaps, CNM. Order placed for Loestrin 24 Fe #3/0RF.  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 Patient verbalizes understanding and is agreeable.  Routing to provider for final review. Patient is agreeable to disposition. Will close encounter.

## 2017-01-05 ENCOUNTER — Ambulatory Visit: Payer: 59 | Admitting: Certified Nurse Midwife

## 2017-01-21 ENCOUNTER — Ambulatory Visit (INDEPENDENT_AMBULATORY_CARE_PROVIDER_SITE_OTHER): Payer: 59 | Admitting: Certified Nurse Midwife

## 2017-01-21 ENCOUNTER — Encounter: Payer: Self-pay | Admitting: Certified Nurse Midwife

## 2017-01-21 VITALS — BP 110/68 | HR 80 | Resp 16 | Ht 63.0 in | Wt 167.0 lb

## 2017-01-21 DIAGNOSIS — Z01419 Encounter for gynecological examination (general) (routine) without abnormal findings: Secondary | ICD-10-CM | POA: Diagnosis not present

## 2017-01-21 DIAGNOSIS — Z30011 Encounter for initial prescription of contraceptive pills: Secondary | ICD-10-CM

## 2017-01-21 DIAGNOSIS — Z124 Encounter for screening for malignant neoplasm of cervix: Secondary | ICD-10-CM

## 2017-01-21 MED ORDER — NORETHIN ACE-ETH ESTRAD-FE 1-20 MG-MCG PO TABS: 1.0000 | ORAL_TABLET | Freq: Every day | ORAL | 4 refills | Status: DC

## 2017-01-21 MED FILL — NORETHIN-ESTRAD-FERR 1-0.02: 1-20 | 84 days supply | Qty: 84 | Fill #0

## 2017-01-21 NOTE — Patient Instructions (Signed)

## 2017-01-21 NOTE — Progress Notes (Signed)
36 y.o. G32P0010 Married  Caucasian Fe here for annual exam. Periods normal no issues.Contraception working well, desires continuance. Has noted more acne with Loestrin 24 Fe, feels the 3 extra days causing more bloating. Emotionally OK since TAB. Normal bleeding profile.Would like to switch to Loestrin 1/20 Fe. 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 Sees PCP for aex and labs. No health issues today. Had her interview for DNP program!   Patient's last menstrual period was 12/27/2016.          Sexually active: Yes.    The current method of family planning is OCP (estrogen/progesterone).    Exercising: Yes.    walking Smoker:  no  Health Maintenance: Pap:  11-23-13 neg HPV HR neg, 01-02-16 neg MMG:  none Colonoscopy:  none BMD:   none TDaP:  2008 Shingles: no Pneumonia: no Hep C and HIV: not done Labs: PCP does all labs Self breast exam: occasionally   reports that she has quit smoking. She has never used smokeless tobacco. She reports that she drinks about 1.2 oz of alcohol per week . She reports that she does not use drugs.  Past Medical History:  Diagnosis Date  . Asthma   . Hematuria    negative workup with urology    Past Surgical History:  Procedure Laterality Date  . BIOPSY Right 08/31/2014   Procedure: BIOPSY right peritoneal side wall ;  Surgeon: Jamey Reas de Berton Lan, MD;  Location: [facility];  Service: Gynecology;  Laterality: Right;  . DILATION AND EVACUATION N/A 08/31/2014   Procedure: DILATATION AND EVACUATION, FROZEN SECTION;  Surgeon: Everardo All Amundson de Berton Lan, MD;  Location: [facility];  Service: Gynecology;  Laterality: N/A;  . ECTOPIC PREGNANCY SURGERY Right 2015  . LAPAROSCOPIC OVARIAN CYSTECTOMY Right 08/31/2014   Procedure: LAPAROSCOPIC bilateral  OVARIAN CYSTs drainage  drainage of right  tubal cyst;  Surgeon: Jamey Reas de Berton Lan, MD;  Location: [facility];  Service: Gynecology;  Laterality: Right;  . LAPAROSCOPY N/A  08/31/2014   Procedure:  LAPAROSCOPY OPERATIVE  right salpinostomy ;  Surgeon: Jamey Reas de Berton Lan, MD;  Location: [facility];  Service: Gynecology;  Laterality: N/A;  . TYMPANOSTOMY TUBE PLACEMENT      Current Outpatient Prescriptions  Medication Sig Dispense Refill  . albuterol (PROVENTIL HFA;VENTOLIN HFA) 108 (90 BASE) MCG/ACT inhaler Inhale 1 puff into the lungs every 6 (six) hours as needed for wheezing or shortness of breath.    . cetirizine (ZYRTEC) 10 MG tablet Take 10 mg by mouth as needed.     . naproxen sodium (ANAPROX) 550 MG tablet TAKE 1 TABLET BY MOUTH TWICE A DAY WITH A MEAL (Patient taking differently: prn) 30 tablet 0  . Norethindrone Acetate-Ethinyl Estrad-FE (LOESTRIN 24 FE) 1-20 MG-MCG(24) tablet Take 1 tablet by mouth daily. 3 Package 0  . Probiotic Product (PROBIOTIC PO) Take by mouth.     No current facility-administered medications for this visit.     Family History  Problem Relation Age of Onset  . Hyperlipidemia Father   . Hypertension Father   . Diabetes Father   . Diabetes Paternal Grandfather   . Hypertension Paternal Grandfather     ROS:  Pertinent items are noted in HPI.  Otherwise, a comprehensive ROS was negative.  Exam:   BP 110/68 (BP Location: Right Arm, Patient Position: Sitting, Cuff Size: Normal)   Pulse 80   Resp 16   Ht 5\' 3"  (  1.6 m)   Wt 167 lb (75.8 kg)   LMP 12/27/2016   BMI 29.58 kg/m  Height: 5\' 3"  (160 cm) Ht Readings from Last 3 Encounters:  01/21/17 5\' 3"  (1.6 m)  10/20/16 5' 2.25" (1.581 m)  01/02/16 5' 2.25" (1.581 m)    General appearance: alert, cooperative and appears stated age Head: Normocephalic, without obvious abnormality, atraumatic Neck: no adenopathy, supple, symmetrical, trachea midline and thyroid normal to inspection and palpation Lungs: clear to auscultation bilaterally Breasts: normal appearance, no masses or tenderness, No nipple retraction or dimpling, No nipple discharge or bleeding, No  axillary or supraclavicular adenopathy Heart: regular rate and rhythm Abdomen: soft, non-tender; no masses,  no organomegaly Extremities: extremities normal, atraumatic, no cyanosis or edema Skin: Skin color, texture, turgor normal. No rashes or lesions Lymph nodes: Cervical, supraclavicular, and axillary nodes normal. No abnormal inguinal nodes palpated Neurologic: Grossly normal   Pelvic: External genitalia:  no lesions              Urethra:  normal appearing urethra with no masses, tenderness or lesions              Bartholin's and Skene's: normal                 Vagina: normal appearing vagina with normal color and discharge, no lesions              Cervix: no cervical motion tenderness, no lesions and nulliparous appearance              Pap taken: Yes.   Bimanual Exam:  Uterus:  normal size, contour, position, consistency, mobility, non-tender              Adnexa: normal adnexa and no mass, fullness, tenderness               Rectovaginal: Confirms               Anus:  normal sphincter tone, no lesions  Chaperone present: yes  A:  Well Woman with normal exam  Contraception  OCP desired, would like to try just Loestrin 1/20 Fe for acne change.  P:   Reviewed health and wellness pertinent to exam  Rx Loestrin 1/20 Fe see order with instructions  Pap smear as above with reflex   counseled on use and side effects of OCP's, adequate intake of calcium and vitamin D, diet and exercise  return annually or prn  An After Visit Summary was printed and given to the patient.

## 2017-01-23 NOTE — Progress Notes (Signed)
Encounter reviewed Jill Jertson, MD   

## 2017-01-25 LAB — IPS PAP TEST WITH REFLEX TO HPV

## 2017-02-02 ENCOUNTER — Ambulatory Visit: Payer: 59 | Admitting: Certified Nurse Midwife

## 2017-04-14 MED FILL — LARIN FE 1-20 TABLET: 1-20 | 84 days supply | Qty: 84 | Fill #1

## 2017-05-24 DIAGNOSIS — H5213 Myopia, bilateral: Secondary | ICD-10-CM | POA: Diagnosis not present

## 2017-06-07 DIAGNOSIS — Z13220 Encounter for screening for lipoid disorders: Secondary | ICD-10-CM | POA: Diagnosis not present

## 2017-06-07 DIAGNOSIS — Z0000 Encounter for general adult medical examination without abnormal findings: Secondary | ICD-10-CM | POA: Diagnosis not present

## 2017-06-07 DIAGNOSIS — Z131 Encounter for screening for diabetes mellitus: Secondary | ICD-10-CM | POA: Diagnosis not present

## 2017-06-29 MED FILL — LARIN FE 1-20 TABLET: 1-20 | 84 days supply | Qty: 84 | Fill #2

## 2017-08-14 DIAGNOSIS — Z8759 Personal history of other complications of pregnancy, childbirth and the puerperium: Secondary | ICD-10-CM

## 2017-08-14 HISTORY — DX: Personal history of other complications of pregnancy, childbirth and the puerperium: Z87.59

## 2017-09-19 MED FILL — LARIN FE 1-20 TABLET: 1-20 | 84 days supply | Qty: 84 | Fill #3

## 2017-12-08 DIAGNOSIS — R05 Cough: Secondary | ICD-10-CM | POA: Diagnosis not present

## 2017-12-08 MED FILL — VENTOLIN HFA 90 MCG INHALER: 108 (90 BAS | 16 days supply | Qty: 18 | Fill #0

## 2017-12-08 MED FILL — BENZONATATE 200 MG CAP: 200 | 10 days supply | Qty: 30 | Fill #0

## 2017-12-08 MED FILL — AZITHROMYCIN 250 MG TAB: 250 | 5 days supply | Qty: 6 | Fill #0

## 2017-12-12 MED FILL — LARIN FE 1-20 TABLET: 1-20 | 84 days supply | Qty: 84 | Fill #4

## 2017-12-29 MED FILL — NAPROXEN SODIUM 550 MG TAB: 550 | 45 days supply | Qty: 90 | Fill #0

## 2017-12-29 MED FILL — CLOBETASOL 0.05% OINTMENT: 0.05 | 15 days supply | Qty: 30 | Fill #0

## 2018-01-26 DIAGNOSIS — J040 Acute laryngitis: Secondary | ICD-10-CM | POA: Diagnosis not present

## 2018-01-26 MED FILL — BENZONATATE 100 MG CAP: 100 | 10 days supply | Qty: 30 | Fill #0

## 2018-01-27 ENCOUNTER — Ambulatory Visit: Payer: 59 | Admitting: Certified Nurse Midwife

## 2018-02-04 ENCOUNTER — Encounter: Payer: Self-pay | Admitting: Certified Nurse Midwife

## 2018-02-04 ENCOUNTER — Ambulatory Visit: Payer: BLUE CROSS/BLUE SHIELD | Admitting: Certified Nurse Midwife

## 2018-02-04 ENCOUNTER — Other Ambulatory Visit: Payer: Self-pay

## 2018-02-04 VITALS — BP 120/70 | HR 70 | Resp 16 | Ht 62.25 in | Wt 173.0 lb

## 2018-02-04 DIAGNOSIS — Z3041 Encounter for surveillance of contraceptive pills: Secondary | ICD-10-CM

## 2018-02-04 DIAGNOSIS — Z01419 Encounter for gynecological examination (general) (routine) without abnormal findings: Secondary | ICD-10-CM | POA: Diagnosis not present

## 2018-02-04 MED ORDER — NORETHIN ACE-ETH ESTRAD-FE 1-20 MG-MCG PO TABS: 1.0000 | ORAL_TABLET | Freq: Every day | ORAL | 4 refills | Status: DC

## 2018-02-04 NOTE — Patient Instructions (Signed)

## 2018-02-04 NOTE — Progress Notes (Signed)
37 y.o. G71P0010 Married  Caucasian Fe here for annual exam. Periods normal, no issues.OCP working well. Denies warning signs with use. Desires continuance. Now in FNP program and in clinicals now. Stress level is up but doing well. Seeing Dr. Zadie Rhine PCP yearly for cholesterol management, using  Red Yeast Rice with good results. Watching weight and walking as much as possible. No other health concerns.  Patient's last menstrual period was 01/15/2018 (exact date).          Sexually active: Yes.    The current method of family planning is OCP (estrogen/progesterone).    Exercising: Yes.    walk Smoker:  no  Health Maintenance: Pap:  01-02-16 neg, 01-21-17 neg History of Abnormal Pap: no MMG:  none Self Breast exams: yes Colonoscopy:  none BMD:   none TDaP:  UTD within last 85yrs Shingles: no Pneumonia: had first one Hep C and HIV: not done Labs: no   reports that she has quit smoking. she has never used smokeless tobacco. She reports that she drinks about 1.2 oz of alcohol per week. She reports that she does not use drugs.  Past Medical History:  Diagnosis Date  . Asthma   . Hematuria    negative workup with urology    Past Surgical History:  Procedure Laterality Date  . BIOPSY Right 08/31/2014   Procedure: BIOPSY right peritoneal side wall ;  Surgeon: Jamey Reas de Berton Lan, MD;  Location: [facility];  Service: Gynecology;  Laterality: Right;  . DILATION AND EVACUATION N/A 08/31/2014   Procedure: DILATATION AND EVACUATION, FROZEN SECTION;  Surgeon: Everardo All Amundson de Berton Lan, MD;  Location: [facility];  Service: Gynecology;  Laterality: N/A;  . ECTOPIC PREGNANCY SURGERY Right 2015  . LAPAROSCOPIC OVARIAN CYSTECTOMY Right 08/31/2014   Procedure: LAPAROSCOPIC bilateral  OVARIAN CYSTs drainage  drainage of right  tubal cyst;  Surgeon: Jamey Reas de Berton Lan, MD;  Location: [facility];  Service: Gynecology;  Laterality: Right;  . LAPAROSCOPY N/A 08/31/2014   Procedure:  LAPAROSCOPY OPERATIVE  right salpinostomy ;  Surgeon: Jamey Reas de Berton Lan, MD;  Location: [facility];  Service: Gynecology;  Laterality: N/A;  . TYMPANOSTOMY TUBE PLACEMENT      Current Outpatient Medications  Medication Sig Dispense Refill  . albuterol (PROVENTIL HFA;VENTOLIN HFA) 108 (90 BASE) MCG/ACT inhaler Inhale 1 puff into the lungs every 6 (six) hours as needed for wheezing or shortness of breath.    . cetirizine (ZYRTEC) 10 MG tablet Take 10 mg by mouth as needed.     . Cobalamine Combinations (B12 FOLATE PO) Take by mouth.    . naproxen sodium (ANAPROX) 550 MG tablet TAKE 1 TABLET BY MOUTH TWICE A DAY WITH A MEAL (Patient taking differently: prn) 30 tablet 0  . norethindrone-ethinyl estradiol (JUNEL FE,GILDESS FE,LOESTRIN FE) 1-20 MG-MCG tablet Take 1 tablet by mouth daily. 3 Package 4  . Probiotic Product (PROBIOTIC PO) Take by mouth.    . Red Yeast Rice Extract (RED YEAST RICE PO) Take by mouth. With coq10     No current facility-administered medications for this visit.     Family History  Problem Relation Age of Onset  . Hyperlipidemia Father   . Hypertension Father   . Diabetes Father   . Diabetes Paternal Grandfather   . Hypertension Paternal Grandfather     ROS:  Pertinent items are noted in HPI.  Otherwise, a comprehensive ROS was negative.  Exam:  BP 120/70   Pulse 70   Resp 16   Ht 5' 2.25" (1.581 m)   Wt 173 lb (78.5 kg)   LMP 01/15/2018 (Exact Date)   Breastfeeding? Unknown   BMI 31.39 kg/m  Height: 5' 2.25" (158.1 cm) Ht Readings from Last 3 Encounters:  02/04/18 5' 2.25" (1.581 m)  01/21/17 5\' 3"  (1.6 m)  10/20/16 5' 2.25" (1.581 m)    General appearance: alert, cooperative and appears stated age Head: Normocephalic, without obvious abnormality, atraumatic Neck: no adenopathy, supple, symmetrical, trachea midline and thyroid normal to inspection and palpation Lungs: clear to auscultation bilaterally Breasts: normal  appearance, no masses or tenderness, No nipple retraction or dimpling, No nipple discharge or bleeding, No axillary or supraclavicular adenopathy Heart: regular rate and rhythm Abdomen: soft, non-tender; no masses,  no organomegaly Extremities: extremities normal, atraumatic, no cyanosis or edema Skin: Skin color, texture, turgor normal. No rashes or lesions Lymph nodes: Cervical, supraclavicular, and axillary nodes normal. No abnormal inguinal nodes palpated Neurologic: Grossly normal   Pelvic: External genitalia:  no lesions              Urethra:  normal appearing urethra with no masses, tenderness or lesions              Bartholin's and Skene's: normal                 Vagina: normal appearing vagina with normal color and discharge, no lesions              Cervix: no cervical motion tenderness, no lesions and nulliparous appearance              Pap taken: No. Bimanual Exam:  Uterus:  normal size, contour, position, consistency, mobility, non-tender              Adnexa: normal adnexa and no mass, fullness, tenderness               Rectovaginal: Confirms               Anus:  normal sphincter tone, no lesions  Chaperone present: yes  A:  Well Woman with normal exam  Contraception OCP desired  Cholesterol management with PCP  P:   Reviewed health and wellness pertinent to exam  Risks/benefits/warning signs reviewed of OCP, desires continuance.  Rx Loestrin Fe 1/20 see order with instructions  Continue follow up with PCP as indicated.  Pap smear: no   counseled on breast self exam, use and side effects of OCP's, adequate intake of calcium and vitamin D, diet and exercise  return annually or prn  An After Visit Summary was printed and given to the patient.

## 2018-03-25 ENCOUNTER — Ambulatory Visit: Payer: Self-pay | Admitting: Certified Nurse Midwife

## 2018-06-07 DIAGNOSIS — E7800 Pure hypercholesterolemia, unspecified: Secondary | ICD-10-CM | POA: Diagnosis not present

## 2018-06-08 DIAGNOSIS — Z0001 Encounter for general adult medical examination with abnormal findings: Secondary | ICD-10-CM | POA: Diagnosis not present

## 2018-11-28 DIAGNOSIS — E041 Nontoxic single thyroid nodule: Secondary | ICD-10-CM | POA: Diagnosis not present

## 2018-11-30 ENCOUNTER — Other Ambulatory Visit: Payer: Self-pay | Admitting: Family Medicine

## 2018-11-30 DIAGNOSIS — E041 Nontoxic single thyroid nodule: Secondary | ICD-10-CM

## 2018-12-08 ENCOUNTER — Ambulatory Visit
Admission: RE | Admit: 2018-12-08 | Discharge: 2018-12-08 | Disposition: A | Discharge status: Home / Self Care | Payer: BLUE CROSS/BLUE SHIELD | Source: Ambulatory Visit | Attending: Family Medicine | Admitting: Family Medicine

## 2018-12-08 DIAGNOSIS — E041 Nontoxic single thyroid nodule: Secondary | ICD-10-CM

## 2018-12-08 DIAGNOSIS — E042 Nontoxic multinodular goiter: Secondary | ICD-10-CM | POA: Diagnosis not present

## 2019-01-03 ENCOUNTER — Other Ambulatory Visit: Payer: Self-pay | Admitting: Surgery

## 2019-01-03 DIAGNOSIS — E041 Nontoxic single thyroid nodule: Secondary | ICD-10-CM

## 2019-01-11 ENCOUNTER — Ambulatory Visit
Admission: RE | Admit: 2019-01-11 | Discharge: 2019-01-11 | Disposition: A | Discharge status: Home / Self Care | Payer: BLUE CROSS/BLUE SHIELD | Source: Ambulatory Visit | Attending: Surgery | Admitting: Surgery

## 2019-01-11 DIAGNOSIS — E079 Disorder of thyroid, unspecified: Secondary | ICD-10-CM | POA: Diagnosis not present

## 2019-01-11 DIAGNOSIS — E041 Nontoxic single thyroid nodule: Secondary | ICD-10-CM | POA: Diagnosis not present

## 2019-01-18 DIAGNOSIS — E041 Nontoxic single thyroid nodule: Secondary | ICD-10-CM | POA: Diagnosis not present

## 2019-02-01 ENCOUNTER — Encounter (HOSPITAL_COMMUNITY): Payer: Self-pay

## 2019-02-08 ENCOUNTER — Ambulatory Visit (INDEPENDENT_AMBULATORY_CARE_PROVIDER_SITE_OTHER): Payer: BLUE CROSS/BLUE SHIELD | Admitting: Certified Nurse Midwife

## 2019-02-08 ENCOUNTER — Encounter: Payer: Self-pay | Admitting: Certified Nurse Midwife

## 2019-02-08 ENCOUNTER — Other Ambulatory Visit: Payer: Self-pay

## 2019-02-08 ENCOUNTER — Other Ambulatory Visit (HOSPITAL_COMMUNITY)
Admission: RE | Admit: 2019-02-08 | Discharge: 2019-02-08 | Disposition: A | Discharge status: Home / Self Care | Payer: BLUE CROSS/BLUE SHIELD | Source: Ambulatory Visit | Attending: Certified Nurse Midwife | Admitting: Certified Nurse Midwife

## 2019-02-08 VITALS — BP 118/76 | HR 64 | Resp 16 | Ht 62.5 in | Wt 173.0 lb

## 2019-02-08 DIAGNOSIS — E041 Nontoxic single thyroid nodule: Secondary | ICD-10-CM

## 2019-02-08 DIAGNOSIS — Z124 Encounter for screening for malignant neoplasm of cervix: Secondary | ICD-10-CM | POA: Insufficient documentation

## 2019-02-08 DIAGNOSIS — Z01419 Encounter for gynecological examination (general) (routine) without abnormal findings: Secondary | ICD-10-CM | POA: Diagnosis not present

## 2019-02-08 NOTE — Progress Notes (Signed)
38 y.o. G64P0020 Married  Caucasian Fe here for annual exam. Periods normal, no issues. Exercises 4 times weekly and has maintained weight. Noted thyroid nodule and was biopsied suspicious and saw Dr.Gerkin for removal. Plans to have removal of thyroid in 02/2019. Stopped contraception and using condoms and feels better. Sees Dr.Rankin PCP yearly and has decreased cholesterol with Red Yeast Rice twice daily. No other health changes. Finishing her FNP studies and in clinic area now.   Patient's last menstrual period was 01/31/2019 (exact date).          Sexually active: Yes.    The current method of family planning is condoms all the time.    Exercising: Yes.    walking Smoker:  no  Review of Systems  Constitutional: Negative.   HENT: Negative.   Eyes: Negative.   Respiratory: Negative.   Cardiovascular: Negative.   Gastrointestinal: Negative.   Genitourinary: Negative.   Musculoskeletal: Negative.   Skin:       Nipple itching  Neurological: Negative.   Endo/Heme/Allergies: Negative.   Psychiatric/Behavioral: Negative.     Health Maintenance: Pap:  01-21-17 neg History of Abnormal Pap: no MMG:  none Self Breast exams: occ Colonoscopy:  none BMD:   none TDaP:  UTD within last 66yrs Shingles: no Pneumonia: had first one Hep C and HIV: neg per patient Labs: PCP   reports that she has quit smoking. She has never used smokeless tobacco. She reports current alcohol use of about 2.0 standard drinks of alcohol per week. She reports that she does not use drugs.  Past Medical History:  Diagnosis Date  . Asthma   . Hematuria    negative workup with urology    Past Surgical History:  Procedure Laterality Date  . BIOPSY Right 08/31/2014   Procedure: BIOPSY right peritoneal side wall ;  Surgeon: Jamey Reas de Berton Lan, MD;  Location: [facility];  Service: Gynecology;  Laterality: Right;  . DILATION AND EVACUATION N/A 08/31/2014   Procedure: DILATATION AND EVACUATION, FROZEN  SECTION;  Surgeon: Everardo All Amundson de Berton Lan, MD;  Location: [facility];  Service: Gynecology;  Laterality: N/A;  . ECTOPIC PREGNANCY SURGERY Right 2015  . LAPAROSCOPIC OVARIAN CYSTECTOMY Right 08/31/2014   Procedure: LAPAROSCOPIC bilateral  OVARIAN CYSTs drainage  drainage of right  tubal cyst;  Surgeon: Jamey Reas de Berton Lan, MD;  Location: [facility];  Service: Gynecology;  Laterality: Right;  . LAPAROSCOPY N/A 08/31/2014   Procedure:  LAPAROSCOPY OPERATIVE  right salpinostomy ;  Surgeon: Jamey Reas de Berton Lan, MD;  Location: [facility];  Service: Gynecology;  Laterality: N/A;  . TYMPANOSTOMY TUBE PLACEMENT      Current Outpatient Medications  Medication Sig Dispense Refill  . albuterol (PROVENTIL HFA;VENTOLIN HFA) 108 (90 BASE) MCG/ACT inhaler Inhale 1 puff into the lungs every 6 (six) hours as needed for wheezing or shortness of breath.    . cetirizine (ZYRTEC) 10 MG tablet Take 10 mg by mouth as needed.     . Cobalamine Combinations (B12 FOLATE PO) Take by mouth.    . naproxen sodium (ANAPROX) 550 MG tablet TAKE 1 TABLET BY MOUTH TWICE A DAY WITH A MEAL (Patient taking differently: prn) 30 tablet 0  . norethindrone-ethinyl estradiol (JUNEL FE,GILDESS FE,LOESTRIN FE) 1-20 MG-MCG tablet Take 1 tablet by mouth daily. 3 Package 4  . Probiotic Product (PROBIOTIC PO) Take by mouth.    . Red Yeast Rice Extract (RED YEAST RICE PO)  Take by mouth. With coq10     No current facility-administered medications for this visit.     Family History  Problem Relation Age of Onset  . Hyperlipidemia Father   . Hypertension Father   . Diabetes Father   . Diabetes Paternal Grandfather   . Hypertension Paternal Grandfather     ROS:  Pertinent items are noted in HPI.  Otherwise, a comprehensive ROS was negative.  Exam:   BP 118/76   Pulse 64   Resp 16   Ht 5' 2.5" (1.588 m)   Wt 173 lb (78.5 kg)   LMP 01/31/2019 (Exact Date)   BMI 31.14 kg/m  Height: 5' 2.5" (158.8  cm) Ht Readings from Last 3 Encounters:  02/08/19 5' 2.5" (1.588 m)  02/04/18 5' 2.25" (1.581 m)  01/21/17 5\' 3"  (1.6 m)    General appearance: alert, cooperative and appears stated age Head: Normocephalic, without obvious abnormality, atraumatic Neck: no adenopathy, supple, symmetrical, trachea midline and thyroid nodule palpated on right Lungs: clear to auscultation bilaterally Breasts: normal appearance, no masses or tenderness, No nipple retraction or dimpling, No nipple discharge or bleeding, No axillary or supraclavicular adenopathy, slight dryness noted on left aerola, no scaling, edema or redness or nipple discharge Heart: regular rate and rhythm Abdomen: soft, non-tender; no masses,  no organomegaly Extremities: extremities normal, atraumatic, no cyanosis or edema Skin: Skin color, texture, turgor normal. No rashes or lesions Lymph nodes: Cervical, supraclavicular, and axillary nodes normal. No abnormal inguinal nodes palpated Neurologic: Grossly normal   Pelvic: External genitalia:  no lesions, normal female              Urethra:  normal appearing urethra with no masses, tenderness or lesions              Bartholin's and Skene's: normal                 Vagina: normal appearing vagina with normal color and discharge, no lesions              Cervix: no bleeding following Pap, no cervical motion tenderness, no lesions and nulliparous appearance              Pap taken: Yes.   Bimanual Exam:  Uterus:  normal size, contour, position, consistency, mobility, non-tender              Adnexa: normal adnexa and no mass, fullness, tenderness               Rectovaginal: Confirms               Anus:  normal sphincter tone, no lesions  Chaperone present: yes  A:  Well Woman with normal exam  Contraception condoms  Right thyroid nodule, surgery planned due to suspicious biopsy  Dryness around right nipple area  Cholesterol/allergies with PCP management  P:   Reviewed health and  wellness pertinent to exam  Wished well with surgery with Dr. Harlow Asa regarding thyroid nodule.  Discussed coconut oil for dryness and warning signs given regarding skin changes that should be seen.  Continue follow up with PCP as indicated  Pap smear: yes   counseled on breast self exam, adequate intake of calcium and vitamin D, diet and exercise  return annually or prn  An After Visit Summary was printed and given to the patient.

## 2019-02-08 NOTE — Patient Instructions (Signed)

## 2019-02-10 ENCOUNTER — Ambulatory Visit: Payer: BLUE CROSS/BLUE SHIELD | Admitting: Certified Nurse Midwife

## 2019-02-10 LAB — CYTOLOGY - PAP
Diagnosis: NEGATIVE
HPV: NOT DETECTED

## 2019-02-22 ENCOUNTER — Ambulatory Visit: Payer: Self-pay | Admitting: Surgery

## 2019-02-22 DIAGNOSIS — D440 Neoplasm of uncertain behavior of thyroid gland: Secondary | ICD-10-CM | POA: Diagnosis not present

## 2019-02-22 DIAGNOSIS — E042 Nontoxic multinodular goiter: Secondary | ICD-10-CM | POA: Diagnosis not present

## 2019-03-03 ENCOUNTER — Other Ambulatory Visit: Payer: Self-pay

## 2019-03-03 ENCOUNTER — Encounter (HOSPITAL_COMMUNITY): Payer: Self-pay

## 2019-03-03 NOTE — Patient Instructions (Addendum)
Anjeanette KESHAUNA DEGRAFFENREID  03/03/2019       Your procedure is scheduled on:   03-09-2019   Report to [facility] Main  Entrance, Report to admitting at  6:45 AM    Call this number if you have problems the morning of surgery [phone]        Remember: Do not eat food or drink liquids :After Midnight.  This includes no water, candy, gum, mints  BRUSH YOUR TEETH MORNING OF SURGERY AND RINSE YOUR MOUTH OUT          Take these medicines the morning of surgery with A SIP OF WATER:   Omeprazole,  Zyrtec if needed,  Albuterol if needed and bring with you day of surgery                                    You may not have any metal on your body including hair pins and piercings              Do not wear jewelry, make-up, lotions, powders or perfumes, deodorant              Do not wear nail polish.  Do not shave  48 hours prior to surgery.             Do not bring valuables to the hospital. [place].  Contacts, dentures or bridgework may not be worn into surgery.  Leave suitcase in the car. After surgery it may be brought to your room.     _____________________________________________________________________             [facility] - Preparing for Surgery Before surgery, you can play an important role.  Because skin is not sterile, your skin needs to be as free of germs as possible.  You can reduce the number of germs on your skin by washing with CHG (chlorahexidine gluconate) soap before surgery.  CHG is an antiseptic cleaner which kills germs and bonds with the skin to continue killing germs even after washing. Please DO NOT use if you have an allergy to CHG or antibacterial soaps.  If your skin becomes reddened/irritated stop using the CHG and inform your nurse when you arrive at Short Stay. Do not shave (including legs and underarms) for at least 48 hours prior to the first CHG shower.  You may shave  your face/neck. Please follow these instructions carefully:  1.  Shower with CHG Soap the night before surgery and the  morning of Surgery.  2.  If you choose to wash your hair, wash your hair first as usual with your  normal  shampoo.  3.  After you shampoo, rinse your hair and body thoroughly to remove the  shampoo.                            4.  Use CHG as you would any other liquid soap.  You can apply chg directly  to the skin and wash                       Gently with a scrungie or clean washcloth.  5.  Apply the  CHG Soap to your body ONLY FROM THE NECK DOWN.   Do not use on face/ open                           Wound or open sores. Avoid contact with eyes, ears mouth and genitals (private parts).                       Wash face,  Genitals (private parts) with your normal soap.             6.  Wash thoroughly, paying special attention to the area where your surgery  will be performed.  7.  Thoroughly rinse your body with warm water from the neck down.  8.  DO NOT shower/wash with your normal soap after using and rinsing off  the CHG Soap.             9.  Pat yourself dry with a clean towel.            10.  Wear clean pajamas.            11.  Place clean sheets on your bed the night of your first shower and do not  sleep with pets. Day of Surgery : Do not apply any lotions/deodorants the morning of surgery.  Please wear clean clothes to the hospital/surgery center.  FAILURE TO FOLLOW THESE INSTRUCTIONS MAY RESULT IN THE CANCELLATION OF YOUR SURGERY PATIENT SIGNATURE_________________________________  NURSE SIGNATURE__________________________________  ________________________________________________________________________

## 2019-03-06 ENCOUNTER — Encounter (HOSPITAL_COMMUNITY): Payer: Self-pay

## 2019-03-06 ENCOUNTER — Encounter (HOSPITAL_COMMUNITY)
Admission: RE | Admit: 2019-03-06 | Discharge: 2019-03-06 | Disposition: A | Discharge status: Home / Self Care | Payer: BLUE CROSS/BLUE SHIELD | Source: Ambulatory Visit | Attending: Surgery | Admitting: Surgery

## 2019-03-06 ENCOUNTER — Other Ambulatory Visit: Payer: Self-pay

## 2019-03-06 DIAGNOSIS — D440 Neoplasm of uncertain behavior of thyroid gland: Secondary | ICD-10-CM | POA: Insufficient documentation

## 2019-03-06 DIAGNOSIS — E042 Nontoxic multinodular goiter: Secondary | ICD-10-CM | POA: Insufficient documentation

## 2019-03-06 DIAGNOSIS — Z01812 Encounter for preprocedural laboratory examination: Secondary | ICD-10-CM | POA: Insufficient documentation

## 2019-03-06 HISTORY — DX: Exercise induced bronchospasm: J45.990

## 2019-03-06 HISTORY — DX: Other seasonal allergic rhinitis: J30.2

## 2019-03-06 HISTORY — DX: Presence of spectacles and contact lenses: Z97.3

## 2019-03-06 HISTORY — DX: Neoplasm of unspecified behavior of endocrine glands and other parts of nervous system: D49.7

## 2019-03-06 HISTORY — DX: Gastro-esophageal reflux disease without esophagitis: K21.9

## 2019-03-06 HISTORY — DX: Nontoxic multinodular goiter: E04.2

## 2019-03-06 HISTORY — DX: Personal history of other complications of pregnancy, childbirth and the puerperium: Z87.59

## 2019-03-06 HISTORY — DX: Other specified disorders of nose and nasal sinuses: J34.89

## 2019-03-06 HISTORY — DX: Personal history of diseases of the blood and blood-forming organs and certain disorders involving the immune mechanism: Z86.2

## 2019-03-06 LAB — CBC
HCT: 40 % (ref 36.0–46.0)
Hemoglobin: 13.4 g/dL (ref 12.0–15.0)
MCH: 30.9 pg (ref 26.0–34.0)
MCHC: 33.5 g/dL (ref 30.0–36.0)
MCV: 92.2 fL (ref 80.0–100.0)
Platelets: 308 10*3/uL (ref 150–400)
RBC: 4.34 MIL/uL (ref 3.87–5.11)
RDW: 11.9 % (ref 11.5–15.5)
WBC: 5.1 10*3/uL (ref 4.0–10.5)
nRBC: 0 % (ref 0.0–0.2)

## 2019-03-06 LAB — BASIC METABOLIC PANEL
Anion gap: 9 (ref 5–15)
BUN: 12 mg/dL (ref 6–20)
CO2: 24 mmol/L (ref 22–32)
Calcium: 9.3 mg/dL (ref 8.9–10.3)
Chloride: 105 mmol/L (ref 98–111)
Creatinine, Ser: 0.69 mg/dL (ref 0.44–1.00)
GFR calc Af Amer: >60 mL/min (ref >60)
GFR calc non Af Amer: >60 mL/min (ref >60)
Glucose, Bld: 90 mg/dL (ref 70–99)
Potassium: 3.8 mmol/L (ref 3.5–5.1)
Sodium: 138 mmol/L (ref 135–145)

## 2019-03-06 MED ORDER — CHLORHEXIDINE GLUCONATE CLOTH 2 % EX PADS
6.0000 | MEDICATED_PAD | Freq: Once | CUTANEOUS | Status: DC
  Filled 2019-03-06: qty 6

## 2019-03-06 NOTE — Progress Notes (Signed)
Chart to anesthesia for review, Konrad Felix PA.

## 2019-03-06 NOTE — Progress Notes (Signed)
Anesthesia Chart Review   Case:  [id number] Date/Time:  03/09/19 0830   Procedure:  TOTAL THYROID LOBECTOMY (N/A )   Anesthesia type:  General   Pre-op diagnosis:  thyroid neoplasm of uncertain behavior   Location:  [facility]   Surgeon:  Armandina Gemma, MD      DISCUSSION:38 yo former smoker (quit 03/05/09) with h/o GERD, thyroid neoplasm of uncertain behavior scheduled for above procedure 03/09/19 with Dr. Armandina Gemma.   Pt can proceed with planned procedure barring acute status change.  VS: BP 123/75   Pulse 83   Temp 36.7 C (Oral)   Resp 16   Ht 5' 2.5" (1.588 m)   Wt 77.1 kg   LMP 02/27/2019 (Exact Date)   SpO2 99%   Breastfeeding No   BMI 30.60 kg/m   PROVIDERS: Rankins, Bill Salinas, MD   LABS: Labs reviewed: Acceptable for surgery. (all labs ordered are listed, but only abnormal results are displayed)  Labs Reviewed  BASIC METABOLIC PANEL  CBC     IMAGES:   EKG:   CV:  Past Medical History:  Diagnosis Date  . Exercise-induced asthma   . GERD (gastroesophageal reflux disease)   . History of anemia teen  . History of ectopic pregnancy 08/2017   s/p  right salpingectomy  . Multiple thyroid nodules   . Seasonal allergies   . Sinus drainage   . Thyroid neoplasm   . Wears contact lenses     Past Surgical History:  Procedure Laterality Date  . BIOPSY Right 08/31/2014   Procedure: BIOPSY right peritoneal side wall ;  Surgeon: Jamey Reas de Berton Lan, MD;  Location: [facility];  Service: Gynecology;  Laterality: Right;  . DILATION AND EVACUATION N/A 08/31/2014   Procedure: DILATATION AND EVACUATION, FROZEN SECTION;  Surgeon: Everardo All Amundson de Berton Lan, MD;  Location: [facility];  Service: Gynecology;  Laterality: N/A;  . LAPAROSCOPIC OVARIAN CYSTECTOMY Right 08/31/2014   Procedure: LAPAROSCOPIC bilateral  OVARIAN CYSTs drainage  drainage of right  tubal cyst;  Surgeon: Jamey Reas de Berton Lan, MD;  Location: [facility];   Service: Gynecology;  Laterality: Right;  . LAPAROSCOPY N/A 08/31/2014   Procedure:  LAPAROSCOPY OPERATIVE  right salpinostomy ;  Surgeon: Jamey Reas de Berton Lan, MD;  Location: [facility];  Service: Gynecology;  Laterality: N/A;  . TYMPANOSTOMY TUBE PLACEMENT Bilateral child    MEDICATIONS: . calcium carbonate (TUMS - DOSED IN MG ELEMENTAL CALCIUM) 500 MG chewable tablet  . omeprazole (PRILOSEC) 20 MG capsule  . albuterol (PROVENTIL HFA;VENTOLIN HFA) 108 (90 BASE) MCG/ACT inhaler  . cetirizine (ZYRTEC) 10 MG tablet  . naproxen sodium (ALEVE) 220 MG tablet  . naproxen sodium (ANAPROX) 550 MG tablet  . Probiotic Product (PROBIOTIC PO)  . Red Yeast Rice 600 MG TABS   . Chlorhexidine Gluconate Cloth 2 % PADS 6 each   And  . Chlorhexidine Gluconate Cloth 2 % PADS 6 each   Maia Plan [facility] Pre-Surgical Testing [phone] 03/06/19 3:14 PM

## 2019-03-09 ENCOUNTER — Observation Stay (HOSPITAL_COMMUNITY)
Admission: RE | Admit: 2019-03-09 | Discharge: 2019-03-10 | Disposition: A | Discharge status: Home / Self Care | Payer: BLUE CROSS/BLUE SHIELD | Attending: Surgery | Admitting: Surgery

## 2019-03-09 ENCOUNTER — Ambulatory Visit (HOSPITAL_COMMUNITY): Payer: BLUE CROSS/BLUE SHIELD | Admitting: Anesthesiology

## 2019-03-09 ENCOUNTER — Encounter (HOSPITAL_COMMUNITY)
Admission: RE | Disposition: A | Discharge status: Home / Self Care | Payer: Self-pay | Source: Home / Self Care | Attending: Surgery

## 2019-03-09 ENCOUNTER — Encounter (HOSPITAL_COMMUNITY): Payer: Self-pay | Admitting: *Deleted

## 2019-03-09 ENCOUNTER — Other Ambulatory Visit: Payer: Self-pay

## 2019-03-09 ENCOUNTER — Ambulatory Visit (HOSPITAL_COMMUNITY): Payer: BLUE CROSS/BLUE SHIELD | Admitting: Physician Assistant

## 2019-03-09 DIAGNOSIS — J45909 Unspecified asthma, uncomplicated: Secondary | ICD-10-CM | POA: Insufficient documentation

## 2019-03-09 DIAGNOSIS — E042 Nontoxic multinodular goiter: Principal | ICD-10-CM | POA: Insufficient documentation

## 2019-03-09 DIAGNOSIS — Z87891 Personal history of nicotine dependence: Secondary | ICD-10-CM | POA: Insufficient documentation

## 2019-03-09 DIAGNOSIS — Z79899 Other long term (current) drug therapy: Secondary | ICD-10-CM | POA: Insufficient documentation

## 2019-03-09 DIAGNOSIS — D440 Neoplasm of uncertain behavior of thyroid gland: Secondary | ICD-10-CM | POA: Diagnosis present

## 2019-03-09 DIAGNOSIS — K219 Gastro-esophageal reflux disease without esophagitis: Secondary | ICD-10-CM | POA: Diagnosis not present

## 2019-03-09 DIAGNOSIS — E049 Nontoxic goiter, unspecified: Secondary | ICD-10-CM | POA: Diagnosis not present

## 2019-03-09 DIAGNOSIS — E041 Nontoxic single thyroid nodule: Secondary | ICD-10-CM | POA: Diagnosis not present

## 2019-03-09 HISTORY — PX: THYROIDECTOMY: SHX17

## 2019-03-09 LAB — PREGNANCY, URINE: Preg Test, Ur: NEGATIVE

## 2019-03-09 SURGERY — THYROIDECTOMY
Anesthesia: General | Site: Neck

## 2019-03-09 MED ORDER — HYDROMORPHONE HCL 2 MG/ML IJ SOLN
INTRAMUSCULAR | Status: AC
  Filled 2019-03-09: qty 1

## 2019-03-09 MED ORDER — DEXAMETHASONE SODIUM PHOSPHATE 10 MG/ML IJ SOLN
INTRAMUSCULAR | Status: AC
  Filled 2019-03-09: qty 1

## 2019-03-09 MED ORDER — CEFAZOLIN SODIUM-DEXTROSE 2-4 GM/100ML-% IV SOLN
2.0000 g | INTRAVENOUS | Status: AC
  Administered 2019-03-09: 2 g via INTRAVENOUS
  Filled 2019-03-09: qty 100

## 2019-03-09 MED ORDER — ROCURONIUM BROMIDE 10 MG/ML (PF) SYRINGE
PREFILLED_SYRINGE | INTRAVENOUS | Status: DC | PRN
  Administered 2019-03-09 (×2): 10 mg via INTRAVENOUS
  Administered 2019-03-09: 50 mg via INTRAVENOUS

## 2019-03-09 MED ORDER — LACTATED RINGERS IV SOLN
INTRAVENOUS | Status: DC
  Administered 2019-03-09: 07:00:00 via INTRAVENOUS

## 2019-03-09 MED ORDER — ACETAMINOPHEN 325 MG PO TABS
650.0000 mg | ORAL_TABLET | Freq: Four times a day (QID) | ORAL | Status: DC | PRN
  Administered 2019-03-09 – 2019-03-10 (×2): 650 mg via ORAL
  Filled 2019-03-09 (×2): qty 2

## 2019-03-09 MED ORDER — PROPOFOL 10 MG/ML IV BOLUS
INTRAVENOUS | Status: AC
  Filled 2019-03-09: qty 20

## 2019-03-09 MED ORDER — LIDOCAINE 2% (20 MG/ML) 5 ML SYRINGE
INTRAMUSCULAR | Status: AC
  Filled 2019-03-09: qty 5

## 2019-03-09 MED ORDER — KCL IN DEXTROSE-NACL 20-5-0.45 MEQ/L-%-% IV SOLN
INTRAVENOUS | Status: DC
  Administered 2019-03-09: 13:00:00 via INTRAVENOUS
  Filled 2019-03-09: qty 1000

## 2019-03-09 MED ORDER — FENTANYL CITRATE (PF) 250 MCG/5ML IJ SOLN
INTRAMUSCULAR | Status: AC
  Filled 2019-03-09: qty 5

## 2019-03-09 MED ORDER — PROPOFOL 10 MG/ML IV BOLUS
INTRAVENOUS | Status: DC | PRN
  Administered 2019-03-09: 160 mg via INTRAVENOUS

## 2019-03-09 MED ORDER — ONDANSETRON HCL 4 MG/2ML IJ SOLN
INTRAMUSCULAR | Status: AC
  Filled 2019-03-09: qty 2

## 2019-03-09 MED ORDER — 0.9 % SODIUM CHLORIDE (POUR BTL) OPTIME
TOPICAL | Status: DC | PRN
  Administered 2019-03-09: 1000 mL

## 2019-03-09 MED ORDER — ROCURONIUM BROMIDE 10 MG/ML (PF) SYRINGE
PREFILLED_SYRINGE | INTRAVENOUS | Status: AC
  Filled 2019-03-09: qty 10

## 2019-03-09 MED ORDER — DEXAMETHASONE SODIUM PHOSPHATE 10 MG/ML IJ SOLN
INTRAMUSCULAR | Status: DC | PRN
  Administered 2019-03-09: 8 mg via INTRAVENOUS

## 2019-03-09 MED ORDER — MEPERIDINE HCL 50 MG/ML IJ SOLN: 6.2500 mg | INTRAMUSCULAR | Status: DC | PRN

## 2019-03-09 MED ORDER — CALCIUM CARBONATE 1250 (500 CA) MG PO TABS
2.0000 | ORAL_TABLET | Freq: Three times a day (TID) | ORAL | Status: DC
  Administered 2019-03-09 – 2019-03-10 (×3): 1000 mg via ORAL
  Filled 2019-03-09 (×4): qty 1

## 2019-03-09 MED ORDER — OXYCODONE HCL 5 MG PO TABS: 5.0000 mg | ORAL_TABLET | ORAL | Status: DC | PRN

## 2019-03-09 MED ORDER — LIDOCAINE 2% (20 MG/ML) 5 ML SYRINGE
INTRAMUSCULAR | Status: DC | PRN
  Administered 2019-03-09: 50 mg via INTRAVENOUS

## 2019-03-09 MED ORDER — MIDAZOLAM HCL 2 MG/2ML IJ SOLN
INTRAMUSCULAR | Status: AC
  Filled 2019-03-09: qty 2

## 2019-03-09 MED ORDER — OXYCODONE HCL 5 MG/5ML PO SOLN: 5.0000 mg | Freq: Once | ORAL | Status: DC | PRN

## 2019-03-09 MED ORDER — PROMETHAZINE HCL 25 MG/ML IJ SOLN: 6.2500 mg | INTRAMUSCULAR | Status: DC | PRN

## 2019-03-09 MED ORDER — SUGAMMADEX SODIUM 200 MG/2ML IV SOLN
INTRAVENOUS | Status: AC
  Filled 2019-03-09: qty 2

## 2019-03-09 MED ORDER — HYDROMORPHONE HCL 1 MG/ML IJ SOLN
INTRAMUSCULAR | Status: AC
  Filled 2019-03-09: qty 1

## 2019-03-09 MED ORDER — HYDROMORPHONE HCL 1 MG/ML IJ SOLN
0.2500 mg | INTRAMUSCULAR | Status: DC | PRN
  Administered 2019-03-09 (×2): 0.5 mg via INTRAVENOUS

## 2019-03-09 MED ORDER — OXYCODONE HCL 5 MG PO TABS: 5.0000 mg | ORAL_TABLET | Freq: Once | ORAL | Status: DC | PRN

## 2019-03-09 MED ORDER — FENTANYL CITRATE (PF) 100 MCG/2ML IJ SOLN
INTRAMUSCULAR | Status: DC | PRN
  Administered 2019-03-09: 50 ug via INTRAVENOUS
  Administered 2019-03-09: 100 ug via INTRAVENOUS
  Administered 2019-03-09 (×2): 50 ug via INTRAVENOUS

## 2019-03-09 MED ORDER — ONDANSETRON HCL 4 MG/2ML IJ SOLN: 4.0000 mg | Freq: Four times a day (QID) | INTRAMUSCULAR | Status: DC | PRN

## 2019-03-09 MED ORDER — TRAMADOL HCL 50 MG PO TABS
50.0000 mg | ORAL_TABLET | Freq: Four times a day (QID) | ORAL | Status: DC | PRN
  Administered 2019-03-09 – 2019-03-10 (×3): 50 mg via ORAL
  Filled 2019-03-09 (×3): qty 1

## 2019-03-09 MED ORDER — ONDANSETRON 4 MG PO TBDP: 4.0000 mg | ORAL_TABLET | Freq: Four times a day (QID) | ORAL | Status: DC | PRN

## 2019-03-09 MED ORDER — HYDROMORPHONE HCL 1 MG/ML IJ SOLN: 1.0000 mg | INTRAMUSCULAR | Status: DC | PRN

## 2019-03-09 MED ORDER — ACETAMINOPHEN 650 MG RE SUPP: 650.0000 mg | Freq: Four times a day (QID) | RECTAL | Status: DC | PRN

## 2019-03-09 MED ORDER — ONDANSETRON HCL 4 MG/2ML IJ SOLN
INTRAMUSCULAR | Status: DC | PRN
  Administered 2019-03-09: 4 mg via INTRAVENOUS

## 2019-03-09 MED ORDER — HYDROMORPHONE HCL 1 MG/ML IJ SOLN
INTRAMUSCULAR | Status: DC | PRN
  Administered 2019-03-09 (×4): 0.5 mg via INTRAVENOUS

## 2019-03-09 MED ORDER — SUGAMMADEX SODIUM 200 MG/2ML IV SOLN
INTRAVENOUS | Status: DC | PRN
  Administered 2019-03-09: 150 mg via INTRAVENOUS

## 2019-03-09 MED ORDER — ALBUTEROL SULFATE (2.5 MG/3ML) 0.083% IN NEBU: 3.0000 mL | INHALATION_SOLUTION | Freq: Four times a day (QID) | RESPIRATORY_TRACT | Status: DC | PRN

## 2019-03-09 MED ORDER — MIDAZOLAM HCL 5 MG/5ML IJ SOLN
INTRAMUSCULAR | Status: DC | PRN
  Administered 2019-03-09: 2 mg via INTRAVENOUS

## 2019-03-09 SURGICAL SUPPLY — 28 items
ATTRACTOMAT 16X20 MAGNETIC DRP (DRAPES) ×2 IMPLANT
BLADE SURG 15 STRL LF DISP TIS (BLADE) ×1 IMPLANT
BLADE SURG 15 STRL SS (BLADE) ×1
CHLORAPREP W/TINT 26 (MISCELLANEOUS) ×2 IMPLANT
CLIP VESOCCLUDE MED 6/CT (CLIP) ×4 IMPLANT
CLIP VESOCCLUDE SM WIDE 6/CT (CLIP) ×6 IMPLANT
COVER SURGICAL LIGHT HANDLE (MISCELLANEOUS) ×2 IMPLANT
COVER WAND RF STERILE (DRAPES) ×2 IMPLANT
DERMABOND ADVANCED (GAUZE/BANDAGES/DRESSINGS) ×1
DERMABOND ADVANCED .7 DNX12 (GAUZE/BANDAGES/DRESSINGS) ×1 IMPLANT
DRAPE LAPAROTOMY T 98X78 PEDS (DRAPES) ×2 IMPLANT
ELECT PENCIL ROCKER SW 15FT (MISCELLANEOUS) ×2 IMPLANT
ELECT REM PT RETURN 15FT ADLT (MISCELLANEOUS) ×2 IMPLANT
GAUZE 4X4 16PLY RFD (DISPOSABLE) ×2 IMPLANT
GLOVE SURG ORTHO 8.0 STRL STRW (GLOVE) ×2 IMPLANT
GOWN STRL REUS W/TWL XL LVL3 (GOWN DISPOSABLE) ×8 IMPLANT
HEMOSTAT SURGICEL 2X4 FIBR (HEMOSTASIS) ×2 IMPLANT
ILLUMINATOR WAVEGUIDE N/F (MISCELLANEOUS) ×2 IMPLANT
KIT BASIN OR (CUSTOM PROCEDURE TRAY) ×2 IMPLANT
KIT TURNOVER KIT A (KITS) IMPLANT
PACK BASIC VI WITH GOWN DISP (CUSTOM PROCEDURE TRAY) ×2 IMPLANT
SHEARS HARMONIC 9CM CVD (BLADE) ×2 IMPLANT
SUT MNCRL AB 4-0 PS2 18 (SUTURE) ×2 IMPLANT
SUT VIC AB 3-0 SH 18 (SUTURE) ×4 IMPLANT
SYR BULB IRRIGATION 50ML (SYRINGE) ×2 IMPLANT
TOWEL OR 17X26 10 PK STRL BLUE (TOWEL DISPOSABLE) ×2 IMPLANT
TOWEL OR NON WOVEN STRL DISP B (DISPOSABLE) ×2 IMPLANT
TUBING CONNECTING 10 (TUBING) ×2 IMPLANT

## 2019-03-09 NOTE — Anesthesia Preprocedure Evaluation (Signed)
Anesthesia Evaluation  Patient identified by MRN, date of birth, ID band Patient awake    Reviewed: Allergy & Precautions, H&P , NPO status , Patient's Chart, lab work & pertinent test results  Airway Mallampati: II  TM Distance: >3 FB Neck ROM: Full    Dental no notable dental hx. (+) Teeth Intact   Pulmonary asthma , former smoker,    Pulmonary exam normal breath sounds clear to auscultation       Cardiovascular negative cardio ROS Normal cardiovascular exam Rhythm:Regular Rate:Normal     Neuro/Psych negative neurological ROS  negative psych ROS   GI/Hepatic negative GI ROS, Neg liver ROS,   Endo/Other  negative endocrine ROS  Renal/GU negative Renal ROS  negative genitourinary   Musculoskeletal negative musculoskeletal ROS (+)   Abdominal   Peds  Hematology negative hematology ROS (+)   Anesthesia Other Findings   Reproductive/Obstetrics negative OB ROS Missed Ab                             Anesthesia Physical  Anesthesia Plan  ASA: II  Anesthesia Plan: General   Post-op Pain Management:    Induction: Intravenous and Rapid sequence  PONV Risk Score and Plan: 4 or greater and Ondansetron, Dexamethasone, Midazolam and Scopolamine patch - Pre-op  Airway Management Planned: Oral ETT  Additional Equipment: None  Intra-op Plan:   Post-operative Plan: Extubation in OR  Informed Consent: I have reviewed the patients History and Physical, chart, labs and discussed the procedure including the risks, benefits and alternatives for the proposed anesthesia with the patient or authorized representative who has indicated his/her understanding and acceptance.     Dental advisory given  Plan Discussed with: CRNA  Anesthesia Plan Comments:        Anesthesia Quick Evaluation

## 2019-03-09 NOTE — Transfer of Care (Signed)
Immediate Anesthesia Transfer of Care Note  Patient: Nicole [name]  Procedure(s) Performed: Procedure(s): TOTAL THYROIDECTOMY (N/A)  Patient Location: PACU  Anesthesia Type:General  Level of Consciousness:  sedated, patient cooperative and responds to stimulation  Airway & Oxygen Therapy:Patient Spontanous Breathing and Patient connected to face mask oxgen  Post-op Assessment:  Report given to PACU RN and Post -op Vital signs reviewed and stable  Post vital signs:  Reviewed and stable  Last Vitals:  Vitals:   03/09/19 0701  BP: 129/86  Pulse: 93  Resp: 14  Temp: 36.5 C  SpO2: 78%    Complications: No apparent anesthesia complications

## 2019-03-09 NOTE — H&P (Signed)
General Surgery [facility]  Ia Leeb Documented: 02/22/2019 10:19 AM Location: [facility] Patient #: [id number] DOB: [birth year] Married / Language: Cleophus Molt / Race: White Female   History of Present Illness Earnstine Regal MD; 02/22/2019 11:10 AM) The patient is a 38 year old female who presents with a thyroid nodule.  CHIEF COMPLAINT: thyroid neoplasm of uncertain behavior  Patient is referred by Dr. Milagros Evener for surgical evaluation and management of thyroid neoplasm of uncertain behavior. 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 In December 2019 following a massage, she had noted pain in the right anterior neck. She asked me on rounds to evaluate and I could palpate a mass. Patient arranged for an ultrasound examination which was performed on December 08, 2018. This showed an enlarged thyroid gland with the right lobe measuring 6.2 cm and left lobe measuring 6.0 cm. There was a dominant nodule measuring 4.5 cm in the right thyroid lobe. There were 2 dominant nodules in the left lobe measuring 1.2 cm and 4.7 cm. Patient underwent fine-needle aspiration biopsy of the dominant nodule on the right. This showed cytologic atypia, category 3. Patient subsequently underwent molecular genetic testing which returned with a suspicious result, yielding a risk of malignancy of approximately 50%. Patient now presents to discuss these findings and make a decision regarding surgical intervention. Patient has no prior history of thyroid disease. TSH level from December 2019 is normal at 0.91. Patient has had no prior head or neck surgery. There is no family history of thyroid disease or thyroid malignancy. There is no family history of other endocrine neoplasms. Patient denies any significant compressive symptoms. She denies tremors or palpitations.   Diagnostic Studies  History Sabino Gasser, CMA; 02/22/2019 10:19 AM) Colonoscopy  never Pap Smear  1-5 years ago  Allergies Sabino Gasser, CMA; 02/22/2019 10:20 AM) No Known Drug Allergies [02/22/2019]: Allergies Reconciled   Medication History Sabino Gasser, CMA; 02/22/2019 10:21 AM) Red Yeast Rice (600MG  Tablet, Oral) Active. Cetirizine HCl (10MG  Capsule, Oral) Active. Medications Reconciled  Social History Sabino Gasser, CMA; 02/22/2019 10:19 AM) Alcohol use  Occasional alcohol use. Caffeine use  Coffee. No drug use  Tobacco use  Former smoker.  Family History Sabino Gasser, [place]; 02/22/2019 10:19 AM) Colon Polyps  Father. Depression  Sister. Diabetes Mellitus  Father. Heart Disease  Father. Heart disease in female family member before age 3  Hypertension  Father.  Pregnancy / Birth History Sabino Gasser, [place]; 02/22/2019 10:19 AM) Age at menarche  24 years. Gravida  2 Maternal age  2-35 Para  0 Regular periods   Other Problems Sabino Gasser, CMA; 02/22/2019 10:19 AM) Gastroesophageal Reflux Disease     Review of Systems Sabino Gasser CMA; 02/22/2019 10:19 AM) General Not Present- Appetite Loss, Chills, Fatigue, Fever, Night Sweats, Weight Gain and Weight Loss. Skin Not Present- Change in Wart/Mole, Dryness, Hives, Jaundice, New Lesions, Non-Healing Wounds, Rash and Ulcer. HEENT Present- Seasonal Allergies and Wears glasses/contact lenses. Not Present- Earache, Hearing Loss, Hoarseness, Nose Bleed, Oral Ulcers, Ringing in the Ears, Sinus Pain, Sore Throat, Visual Disturbances and Yellow Eyes. Respiratory Present- Snoring. Not Present- Bloody sputum, Chronic Cough, Difficulty Breathing and Wheezing. Breast Not Present- Breast Mass, Breast Pain, Nipple Discharge and Skin Changes. Cardiovascular Not Present- Chest Pain, Difficulty Breathing Lying Down, Leg Cramps, Palpitations, Rapid Heart Rate, Shortness of Breath and Swelling of Extremities. Gastrointestinal Present-  Indigestion. Not  Present- Abdominal Pain, Bloating, Bloody Stool, Change in Bowel Habits, Chronic diarrhea, Constipation, Difficulty Swallowing, Excessive gas, Gets full quickly at meals, Hemorrhoids, Nausea, Rectal Pain and Vomiting. Female Genitourinary Not Present- Frequency, Nocturia, Painful Urination, Pelvic Pain and Urgency. Musculoskeletal Not Present- Back Pain, Joint Pain, Joint Stiffness, Muscle Pain, Muscle Weakness and Swelling of Extremities. Neurological Not Present- Decreased Memory, Fainting, Headaches, Numbness, Seizures, Tingling, Tremor, Trouble walking and Weakness. Psychiatric Not Present- Anxiety, Bipolar, Change in Sleep Pattern, Depression, Fearful and Frequent crying. Endocrine Not Present- Cold Intolerance, Excessive Hunger, Hair Changes, Heat Intolerance, Hot flashes and New Diabetes. Hematology Not Present- Blood Thinners, Easy Bruising, Excessive bleeding, Gland problems, HIV and Persistent Infections.  Vitals Sabino Gasser CMA; 02/22/2019 10:22 AM) 02/22/2019 10:21 AM Weight: 173.25 lb Height: 62in Body Surface Area: 1.8 m Body Mass Index: 31.69 kg/m  Temp.: 97.70F(Oral)  Pulse: 98 (Regular)  P.OX: 98% (Room air) BP: 112/72 (Sitting, Left Arm, Standard)       Physical Exam Earnstine Regal MD; 02/22/2019 11:11 AM) The physical exam findings are as follows: Note:See vital signs recorded above  GENERAL APPEARANCE Development: normal Nutritional status: normal Gross deformities: none  SKIN Rash, lesions, ulcers: none Induration, erythema: none Nodules: none palpable  EYES Conjunctiva and lids: normal Pupils: equal and reactive Iris: normal bilaterally  EARS, NOSE, MOUTH, THROAT External ears: no lesion or deformity External nose: no lesion or deformity Hearing: grossly normal Lips: no lesion or deformity Dentition: normal for age Oral mucosa: moist  NECK Symmetric: no Trachea: midline Thyroid: Enlarged thyroid gland, left lobe  greater than right. Palpable large smooth 4-5 cm nodules bilaterally. Nontender. Mobile with swallowing. No associated lymphadenopathy.  CHEST Respiratory effort: normal Retraction or accessory muscle use: no Breath sounds: normal bilaterally Rales, rhonchi, wheeze: none  CARDIOVASCULAR Auscultation: regular rhythm, normal rate Murmurs: none Pulses: carotid and radial pulse 2+ palpable Lower extremity edema: none Lower extremity varicosities: none  MUSCULOSKELETAL Station and gait: normal Digits and nails: no clubbing or cyanosis Muscle strength: grossly normal all extremities Range of motion: grossly normal all extremities Deformity: none  LYMPHATIC Cervical: none palpable Supraclavicular: none palpable  PSYCHIATRIC Oriented to person, place, and time: yes Mood and affect: normal for situation Judgment and insight: appropriate for situation    Assessment & Plan Earnstine Regal MD; 02/22/2019 11:13 AM) MULTIPLE THYROID NODULES (E04.2) Current Plans Pt Education - Pamphlet Given - The Thyroid Book: discussed with patient and provided information. NEOPLASM OF UNCERTAIN BEHAVIOR OF THYROID GLAND (D44.0) Current Plans Patient presents on referral for evaluation of thyroid neoplasm of uncertain behavior and multiple thyroid nodules. She is provided with written literature on thyroid surgery to review at home.  Patient has a dominant mass in the right thyroid lobe which measures 4.5 cm in size. On fine-needle aspiration biopsy there is atypia. On molecular genetic testing this is suspicious with a risk of malignancy of approximately 50%. Patient does have a dominant nodule in the left lobe measuring 4.7 cm and another nodule measuring 1.2 cm which will require continued observation with ultrasound. Based on all these findings, I have recommended proceeding with total thyroidectomy. We discussed the risk and benefits of total thyroidectomy versus thyroid lobectomy. We  discussed the location of the surgical incision. We discussed the hospital stay to be anticipated. We discussed the risk and benefits of surgery including the risk of recurrent laryngeal nerve injury and injury to parathyroid glands. We discussed the need for lifelong thyroid hormone replacement. We discussed the potential need  for radioactive iodine treatment. The patient understands and wishes to proceed with surgery in the near future.  The risks and benefits of the procedure have been discussed at length with the patient. The patient understands the proposed procedure, potential alternative treatments, and the course of recovery to be expected. All of the patient's questions have been answered at this time. The patient wishes to proceed with surgery.   Armandina Gemma, [facility] Office: [phone]

## 2019-03-09 NOTE — Op Note (Signed)
Procedure Note  Pre-operative Diagnosis:  Thyroid neoplasm of uncertain behavior, multiple thyroid nodules  Post-operative Diagnosis:  same  Surgeon:  Armandina Gemma, MD  Assistant:  Greer Pickerel, MD   Procedure:  Total thyroidectomy  Anesthesia:  General  Estimated Blood Loss:  minimal  Drains: none         Specimen: thyroid to pathology  Indications:  Patient is referred by Dr. Milagros Evener for surgical evaluation and management of thyroid neoplasm of uncertain behavior. 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 In December 2019 following a massage, she had noted pain in the right anterior neck. She asked me on rounds to evaluate and I could palpate a mass. Patient arranged for an ultrasound examination which was performed on December 08, 2018. This showed an enlarged thyroid gland with the right lobe measuring 6.2 cm and left lobe measuring 6.0 cm. There was a dominant nodule measuring 4.5 cm in the right thyroid lobe. There were 2 dominant nodules in the left lobe measuring 1.2 cm and 4.7 cm. Patient underwent fine-needle aspiration biopsy of the dominant nodule on the right. This showed cytologic atypia, category 3. Patient subsequently underwent molecular genetic testing which returned with a suspicious result, yielding a risk of malignancy of approximately 50%. Patient now presents to discuss these findings and make a decision regarding surgical intervention.   Procedure Details: Procedure was done in OR #4 at the [facility]. The patient was brought to the operating room and placed in a supine position on the operating room table. Following administration of general anesthesia, the patient was positioned and then prepped and draped in the usual aseptic fashion. After ascertaining that an adequate level of anesthesia had been achieved, a small Kocher incision was made with #15 blade.  Dissection was carried through subcutaneous tissues and platysma.Hemostasis was achieved with the electrocautery. Skin flaps were elevated cephalad and caudad from the thyroid notch to the sternal notch. A Mahorner self-retaining retractor was placed for exposure. Strap muscles were incised in the midline and dissection was begun on the left side.  Strap muscles were reflected laterally.  Left thyroid lobe was markedly enlarged with a dominant nodule greater than 4 cm in diameter.  The left lobe was gently mobilized with blunt dissection. Superior pole vessels were dissected out and divided individually between small and medium ligaclips with the harmonic scalpel. The thyroid lobe was rolled anteriorly. Branches of the inferior thyroid artery were divided between small ligaclips with the harmonic scalpel. Inferior venous tributaries were divided between ligaclips. Both the superior and inferior parathyroid glands were identified and preserved on their vascular pedicles. The ligament of Gwenlyn Found was released with the electrocautery and the gland was mobilized onto the anterior trachea. Isthmus was mobilized across the midline. There was no significant pyramidal lobe present. Dry pack was placed in the left neck.  The right thyroid lobe was gently mobilized with blunt dissection. Right thyroid lobe was moderately enlarged with palpable nodules. Superior pole vessels were dissected out and divided between small and medium ligaclips with the Harmonic scalpel. Superior parathyroid was identified and preserved. Inferior venous tributaries were divided between medium ligaclips with the harmonic scalpel. The right thyroid lobe was rolled anteriorly and the branches of the inferior thyroid artery divided between small ligaclips. The right recurrent laryngeal nerve was identified and preserved along its course. The ligament of Gwenlyn Found was released with the electrocautery. The  right thyroid lobe was mobilized onto the anterior  trachea and the remainder of the thyroid was dissected off the anterior trachea and the thyroid was completely excised. A suture was used to mark the right lobe. The entire thyroid gland was submitted to pathology for review.  The neck was irrigated with warm saline. Fibrillar was placed throughout the operative field. Strap muscles were approximated in the midline with interrupted 3-0 Vicryl sutures. Platysma was closed with interrupted 3-0 Vicryl sutures. Skin was closed with a running 4-0 Monocryl subcuticular suture. Wound was washed and Dermabond was applied. The patient was awakened from anesthesia and brought to the recovery room. The patient tolerated the procedure well.   Armandina Gemma, MD [facility] Office: [phone]

## 2019-03-09 NOTE — Anesthesia Procedure Notes (Addendum)
Procedure Name: Intubation Date/Time: 03/09/2019 9:25 AM Performed by: Anne Fu, CRNA Pre-anesthesia Checklist: Patient identified, Emergency Drugs available, Suction available, Patient being monitored and Timeout performed Patient Re-evaluated:Patient Re-evaluated prior to induction Oxygen Delivery Method: Circle system utilized Preoxygenation: Pre-oxygenation with 100% oxygen Induction Type: IV induction and Rapid sequence Laryngoscope Size: Mac and 4 Grade View: Grade I Tube type: Oral Tube size: 7.0 mm Number of attempts: 1 Airway Equipment and Method: Video-laryngoscopy and Stylet Placement Confirmation: ETT inserted through vocal cords under direct vision,  positive ETCO2 and breath sounds checked- equal and bilateral Secured at: 20 cm Tube secured with: Tape Dental Injury: Teeth and Oropharynx as per pre-operative assessment

## 2019-03-09 NOTE — Anesthesia Postprocedure Evaluation (Signed)
Anesthesia Post Note  Patient: Nicole [name]  Procedure(s) Performed: TOTAL THYROIDECTOMY (N/A Neck)     Patient location during evaluation: PACU Anesthesia Type: General Level of consciousness: sedated and patient cooperative Pain management: pain level controlled Vital Signs Assessment: post-procedure vital signs reviewed and stable Respiratory status: spontaneous breathing Cardiovascular status: stable Anesthetic complications: no    Last Vitals:  Vitals:   03/09/19 1508 03/09/19 1603  BP: 115/75 112/82  Pulse: 85 79  Resp: 17 18  Temp: 37.1 C 36.6 C  SpO2: 99% 99%    Last Pain:  Vitals:   03/09/19 1603  TempSrc: Oral  PainSc:                  Nolon Nations

## 2019-03-10 ENCOUNTER — Encounter (HOSPITAL_COMMUNITY): Payer: Self-pay | Admitting: Surgery

## 2019-03-10 DIAGNOSIS — Z87891 Personal history of nicotine dependence: Secondary | ICD-10-CM | POA: Diagnosis not present

## 2019-03-10 DIAGNOSIS — Z79899 Other long term (current) drug therapy: Secondary | ICD-10-CM | POA: Diagnosis not present

## 2019-03-10 DIAGNOSIS — E042 Nontoxic multinodular goiter: Secondary | ICD-10-CM | POA: Diagnosis not present

## 2019-03-10 DIAGNOSIS — K219 Gastro-esophageal reflux disease without esophagitis: Secondary | ICD-10-CM | POA: Diagnosis not present

## 2019-03-10 DIAGNOSIS — J45909 Unspecified asthma, uncomplicated: Secondary | ICD-10-CM | POA: Diagnosis not present

## 2019-03-10 LAB — BASIC METABOLIC PANEL
Anion gap: 7 (ref 5–15)
BUN: 9 mg/dL (ref 6–20)
CO2: 24 mmol/L (ref 22–32)
Calcium: 8.7 mg/dL — ABNORMAL LOW (ref 8.9–10.3)
Chloride: 104 mmol/L (ref 98–111)
Creatinine, Ser: 0.72 mg/dL (ref 0.44–1.00)
GFR calc Af Amer: >60 mL/min (ref >60)
GFR calc non Af Amer: >60 mL/min (ref >60)
Glucose, Bld: 136 mg/dL — ABNORMAL HIGH (ref 70–99)
Potassium: 4.1 mmol/L (ref 3.5–5.1)
Sodium: 135 mmol/L (ref 135–145)

## 2019-03-10 MED ORDER — LEVOTHYROXINE SODIUM 88 MCG PO TABS: 88.0000 ug | ORAL_TABLET | Freq: Every day | ORAL | 3 refills | Status: DC

## 2019-03-10 MED ORDER — CALCIUM CARBONATE ANTACID 500 MG PO CHEW: 2.0000 | CHEWABLE_TABLET | Freq: Two times a day (BID) | ORAL | 1 refills | Status: DC

## 2019-03-10 MED ORDER — TRAMADOL HCL 50 MG PO TABS: 50.0000 mg | ORAL_TABLET | Freq: Four times a day (QID) | ORAL | 0 refills | Status: DC | PRN

## 2019-03-10 MED FILL — traMADol HCL 50 MG TABS: 50 | 2 days supply | Qty: 15 | Fill #0

## 2019-03-10 MED FILL — LEVOTHYROXINE 88 MCG TABLET: 88 | 30 days supply | Qty: 30 | Fill #0

## 2019-03-10 NOTE — Plan of Care (Signed)
Pt alert and oriented, doing well this am. Pain well controlled with PO pain meds.  Ambulating, voiding, ice applied to neck. RN will monitor.

## 2019-03-10 NOTE — Plan of Care (Signed)
Plan of care reviewed and discussed with the patient. 

## 2019-03-13 NOTE — Discharge Summary (Signed)
Physician Discharge Summary [facility]  Patient ID: Nicole [name] MRN: [id number] DOB/AGE: 1981-11-21 38 y.o.  Admit date: 03/09/2019 Discharge date: 03/13/2019  Admission Diagnoses:  Thyroid neoplasm of uncertain behavior, multiple thyroid nodules  Discharge Diagnoses:  Principal Problem:   Neoplasm of uncertain behavior of thyroid gland Active Problems:   Multiple thyroid nodules   Discharged Condition: good  Hospital Course: Patient was admitted for observation following thyroid surgery.  Post op course was uncomplicated.  Pain was well controlled.  Tolerated diet.  Post op calcium level on morning following surgery was 8.7 mg/dl.  Patient was prepared for discharge home on POD#1.  Consults: None  Treatments: surgery: total thyroidectomy  Discharge Exam: Blood pressure 127/66, pulse 74, temperature 98.4 F (36.9 C), temperature source Oral, resp. rate 14, height 5' 2.5" (1.588 m), weight 77.1 kg, last menstrual period 02/27/2019, SpO2 100 %. See previous note  Disposition: Home  Discharge Instructions    Diet - low sodium heart healthy   Complete by:  As directed    Discharge instructions   Complete by:  As directed    [facility]  THYROID & PARATHYROID SURGERY:  POST-OP INSTRUCTIONS  Always review your discharge instruction sheet from the facility where your surgery was performed.  A prescription for pain medication may be given to you upon discharge.  Take your pain medication as prescribed.  If narcotic pain medicine is not needed, then you may take acetaminophen (Tylenol) or ibuprofen (Advil) as needed.  Take your usually prescribed medications unless otherwise directed.  If you need a refill on your pain medication, please contact our office during regular business hours.  Prescriptions cannot be processed by our office after 5 pm or on weekends.  Start with a light diet upon arrival home, such as soup and crackers or  toast.  Be sure to drink plenty of fluids daily.  Resume your normal diet the day after surgery.  Most patients will experience some swelling and bruising on the chest and neck area.  Ice packs will help.  Swelling and bruising can take several days to resolve.   It is common to experience some constipation after surgery.  Increasing fluid intake and taking a stool softener (Colace) will usually help or prevent this problem.  A mild laxative (Milk of Magnesia or Miralax) should be taken according to package directions if there has been no bowel movement after 48 hours.  You have steri-strips and a gauze dressing over your incision.  You may remove the gauze bandage on the second day after surgery, and you may shower at that time.  Leave your steri-strips (small skin tapes) in place directly over the incision.  These strips should remain on the skin for 5-7 days and then be removed.  You may get them wet in the shower and pat them dry.  You may resume regular (light) daily activities beginning the next day (such as daily self-care, walking, climbing stairs) gradually increasing activities as tolerated.  You may have sexual intercourse when it is comfortable.  Refrain from any heavy lifting or straining until approved by your doctor.  You may drive when you no longer are taking prescription pain medication, you can comfortably wear a seatbelt, and you can safely maneuver your car and apply brakes.  You should see your doctor in the office for a follow-up appointment approximately three weeks after your surgery.  Make sure that you call for this appointment within a  day or two after you arrive home to insure a convenient appointment time.  WHEN TO CALL YOUR DOCTOR: -- Fever greater than 101.5 -- Inability to urinate -- Nausea and/or vomiting - persistent -- Extreme swelling or bruising -- Continued bleeding from incision -- Increased pain, redness, or drainage from the incision -- Difficulty  swallowing or breathing -- Muscle cramping or spasms -- Numbness or tingling in hands or around lips  The clinic staff is available to answer your questions during regular business hours.  Please don't hesitate to call and ask to speak to one of the nurses if you have concerns.  Armandina Gemma, MD [facility] Office: [phone]   Increase activity slowly   Complete by:  As directed    No dressing needed   Complete by:  As directed      Allergies as of 03/10/2019   No Known Allergies     Medication List    TAKE these medications   albuterol 108 (90 Base) MCG/ACT inhaler Commonly known as:  PROVENTIL HFA;VENTOLIN HFA Inhale 1-2 puffs into the lungs every 6 (six) hours as needed for wheezing or shortness of breath.   calcium carbonate 500 MG chewable tablet Commonly known as:  TUMS - dosed in mg elemental calcium Chew 1 tablet by mouth as needed for indigestion or heartburn. What changed:  Another medication with the same name was added. Make sure you understand how and when to take each.   calcium carbonate 500 MG chewable tablet Commonly known as:  Tums Chew 2 tablets (400 mg of elemental calcium total) by mouth 2 (two) times daily. What changed:  You were already taking a medication with the same name, and this prescription was added. Make sure you understand how and when to take each.   cetirizine 10 MG tablet Commonly known as:  ZYRTEC Take 10 mg by mouth daily as needed for allergies.   levothyroxine 88 MCG tablet Commonly known as:  Synthroid Take 1 tablet (88 mcg total) by mouth daily before breakfast.   naproxen sodium 220 MG tablet Commonly known as:  ALEVE Take 220 mg by mouth daily as needed (pain). What changed:  Another medication with the same name was removed. Continue taking this medication, and follow the directions you see here.   omeprazole 20 MG capsule Commonly known as:  PRILOSEC Take 20 mg by mouth as needed.   PROBIOTIC PO  Take 1 capsule by mouth daily.   Red Yeast Rice 600 MG Tabs Take 1,200 mg by mouth 2 (two) times daily.   traMADol 50 MG tablet Commonly known as:  ULTRAM Take 1-2 tablets (50-100 mg total) by mouth every 6 (six) hours as needed.      Follow-up Information    Armandina Gemma, MD. Schedule an appointment as soon as possible for a visit in 3 week(s).   Specialty:  General Surgery Contact information: [address] [place] [address] [phone]           Earnstine Regal, MD, [facility] Office: [phone]   Signed: Armandina Gemma 03/13/2019, 9:20 AM

## 2019-03-27 DIAGNOSIS — D440 Neoplasm of uncertain behavior of thyroid gland: Secondary | ICD-10-CM | POA: Diagnosis not present

## 2019-04-06 MED FILL — LEVOTHYROXINE 88 MCG TABLET: 88 | 30 days supply | Qty: 30 | Fill #1

## 2019-04-12 DIAGNOSIS — D440 Neoplasm of uncertain behavior of thyroid gland: Secondary | ICD-10-CM | POA: Diagnosis not present

## 2019-04-12 DIAGNOSIS — E890 Postprocedural hypothyroidism: Secondary | ICD-10-CM | POA: Diagnosis not present

## 2019-04-17 MED FILL — LEVOTHYROXINE 112 MCG TAB: 112 | 30 days supply | Qty: 30 | Fill #0

## 2019-04-25 DIAGNOSIS — H5213 Myopia, bilateral: Secondary | ICD-10-CM | POA: Diagnosis not present

## 2019-05-14 MED FILL — LEVOTHYROXINE 112 MCG TAB: 112 | 30 days supply | Qty: 30 | Fill #1

## 2019-05-22 DIAGNOSIS — E041 Nontoxic single thyroid nodule: Secondary | ICD-10-CM | POA: Diagnosis not present

## 2019-06-13 MED FILL — LEVOTHYROXINE 112 MCG TAB: 112 | 30 days supply | Qty: 30 | Fill #2

## 2019-07-17 MED FILL — LEVOTHYROXINE 112 MCG TAB: 112 | 30 days supply | Qty: 30 | Fill #3

## 2019-08-09 DIAGNOSIS — E041 Nontoxic single thyroid nodule: Secondary | ICD-10-CM | POA: Diagnosis not present

## 2019-08-09 DIAGNOSIS — E042 Nontoxic multinodular goiter: Secondary | ICD-10-CM | POA: Diagnosis not present

## 2019-08-09 DIAGNOSIS — D440 Neoplasm of uncertain behavior of thyroid gland: Secondary | ICD-10-CM | POA: Diagnosis not present

## 2019-08-11 MED FILL — LEVOTHYROXINE 112 MCG TAB: 112 | 30 days supply | Qty: 30 | Fill #0

## 2019-09-15 MED FILL — LEVOTHYROXINE 112 MCG TAB: 112 | 30 days supply | Qty: 30 | Fill #1

## 2019-09-20 DIAGNOSIS — E7800 Pure hypercholesterolemia, unspecified: Secondary | ICD-10-CM | POA: Diagnosis not present

## 2019-09-20 DIAGNOSIS — Z0000 Encounter for general adult medical examination without abnormal findings: Secondary | ICD-10-CM | POA: Diagnosis not present

## 2019-10-14 MED FILL — LEVOTHYROXINE 112 MCG TAB: 112 | 30 days supply | Qty: 30 | Fill #2

## 2019-11-14 MED FILL — LEVOTHYROXINE 112 MCG TAB: 112 | 30 days supply | Qty: 30 | Fill #3

## 2019-12-14 MED FILL — LEVOTHYROXINE 112 MCG TAB: 112 | 30 days supply | Qty: 30 | Fill #4

## 2020-01-13 MED FILL — LEVOTHYROXINE 112 MCG TAB: 112 | 30 days supply | Qty: 30 | Fill #5

## 2020-02-07 NOTE — Progress Notes (Signed)
39 y.o. G3P0020 Married  Caucasian Fe here for annual exam. Had thyroid removal in 02/2019 with Dr. Harlow Asa, no issues. Trying to work on exercise/diet in the last months of her DNP program. Periods normal, no issues. Planning pregnancy later in this year.Sees PCP Dr. Radene Ou for labs/aex. No other health concerns today.   Patient's last menstrual period was 01/30/2020 (exact date).          Sexually active: Yes.    The current method of family planning is condoms all the time.    Exercising: Yes.    3 times a week Smoker:  no  Review of Systems  Constitutional: Negative.   HENT: Negative.   Eyes: Negative.   Respiratory: Negative.   Cardiovascular: Negative.   Gastrointestinal: Negative.   Genitourinary: Negative.   Musculoskeletal: Negative.   Skin: Negative.   Neurological: Negative.   Endo/Heme/Allergies: Negative.   Psychiatric/Behavioral: Negative.     Health Maintenance: Pap:  01-21-2017 neg, 02-08-2019 neg HPV HR neg History of Abnormal Pap: no MMG:  none Self Breast exams: no Colonoscopy:  none BMD:   none TDaP:  UTD within last 28yrs Shingles: no Pneumonia: had first one Hep C and HIV: neg per patient Labs: if needed   reports that she quit smoking about 10 years ago. Her smoking use included cigarettes. She quit after 10.00 years of use. She has never used smokeless tobacco. She reports current alcohol use of about 4.0 standard drinks of alcohol per week. She reports that she does not use drugs.  Past Medical History:  Diagnosis Date  . Exercise-induced asthma   . GERD (gastroesophageal reflux disease)   . History of anemia teen  . History of ectopic pregnancy 08/2017   s/p  right salpingectomy  . Multiple thyroid nodules   . Seasonal allergies   . Sinus drainage   . Thyroid neoplasm   . Wears contact lenses     Past Surgical History:  Procedure Laterality Date  . BIOPSY Right 08/31/2014   Procedure: BIOPSY right peritoneal side wall ;  Surgeon: Jamey Reas de Berton Lan, MD;  Location: [facility];  Service: Gynecology;  Laterality: Right;  . DILATION AND EVACUATION N/A 08/31/2014   Procedure: DILATATION AND EVACUATION, FROZEN SECTION;  Surgeon: Everardo All Amundson de Berton Lan, MD;  Location: [facility];  Service: Gynecology;  Laterality: N/A;  . LAPAROSCOPIC OVARIAN CYSTECTOMY Right 08/31/2014   Procedure: LAPAROSCOPIC bilateral  OVARIAN CYSTs drainage  drainage of right  tubal cyst;  Surgeon: Jamey Reas de Berton Lan, MD;  Location: [facility];  Service: Gynecology;  Laterality: Right;  . LAPAROSCOPY N/A 08/31/2014   Procedure:  LAPAROSCOPY OPERATIVE  right salpinostomy ;  Surgeon: Jamey Reas de Berton Lan, MD;  Location: [facility];  Service: Gynecology;  Laterality: N/A;  . THYROIDECTOMY N/A 03/09/2019   Procedure: TOTAL THYROIDECTOMY;  Surgeon: Armandina Gemma, MD;  Location: [facility];  Service: General;  Laterality: N/A;  . TYMPANOSTOMY TUBE PLACEMENT Bilateral child    Current Outpatient Medications  Medication Sig Dispense Refill  . albuterol (PROVENTIL HFA;VENTOLIN HFA) 108 (90 BASE) MCG/ACT inhaler Inhale 1-2 puffs into the lungs every 6 (six) hours as needed for wheezing or shortness of breath.     . Ascorbic Acid (VITAMIN C PO) Take 500 mg by mouth.    . calcium carbonate (TUMS - DOSED IN MG ELEMENTAL CALCIUM) 500 MG chewable tablet Chew 1 tablet by mouth as needed for indigestion or  heartburn.    . levocetirizine (XYZAL) 5 MG tablet Take 5 mg by mouth every evening.    Marland Kitchen levothyroxine (SYNTHROID) 112 MCG tablet Take 112 mcg by mouth daily.    . naproxen sodium (ALEVE) 220 MG tablet Take 220 mg by mouth daily as needed (pain).    . Probiotic Product (PROBIOTIC PO) Take 1 capsule by mouth daily.     . Red Yeast Rice 600 MG TABS Take 1,200 mg by mouth 2 (two) times daily.     Marland Kitchen VITAMIN D PO Take 5,000 Int'l Units by mouth.     No current facility-administered medications for this visit.    Family History  Problem  Relation Age of Onset  . Hyperlipidemia Father   . Hypertension Father   . Diabetes Father   . Diabetes Paternal Grandfather   . Hypertension Paternal Grandfather     ROS:  Pertinent items are noted in HPI.  Otherwise, a comprehensive ROS was negative.  Exam:   BP 118/70   Pulse 68   Temp 97.7 F (36.5 C) (Skin)   Resp 16   Ht 5' 2.75" (1.594 m)   Wt 178 lb (80.7 kg)   LMP 01/30/2020 (Exact Date)   BMI 31.78 kg/m  Height: 5' 2.75" (159.4 cm) Ht Readings from Last 3 Encounters:  02/13/20 5' 2.75" (1.594 m)  03/09/19 5' 2.5" (1.588 m)  03/06/19 5' 2.5" (1.588 m)    General appearance: alert, cooperative and appears stated age Head: Normocephalic, without obvious abnormality, atraumatic Neck: no adenopathy, supple, symmetrical, trachea midline and thyroid scar noted from recent Thyroidectomy Lungs: clear to auscultation bilaterally Breasts: normal appearance, no masses or tenderness, No nipple retraction or dimpling, No nipple discharge or bleeding, No axillary or supraclavicular adenopathy Heart: regular rate and rhythm Abdomen: soft, non-tender; no masses,  no organomegaly Extremities: extremities normal, atraumatic, no cyanosis or edema Skin: Skin color, texture, turgor normal. No rashes or lesions Lymph nodes: Cervical, supraclavicular, and axillary nodes normal. No abnormal inguinal nodes palpated Neurologic: Grossly normal   Pelvic: External genitalia:  no lesions              Urethra:  normal appearing urethra with no masses, tenderness or lesions              Bartholin's and Skene's: normal                 Vagina: normal appearing vagina with normal color and discharge, no lesions              Cervix: no cervical motion tenderness, no lesions and nulliparous appearance              Pap taken: No. Bimanual Exam:  Uterus:  normal size, contour, position, consistency, mobility, non-tender and mid position              Adnexa: normal adnexa and no mass, fullness,  tenderness               Rectovaginal: Confirms               Anus:  normal sphincter tone, no lesions  Chaperone present: yes  A:  Well Woman with normal exam  Contraception consistent condoms  Thyroidectomy in 01/2019 for multiple nodules  Social stress with finishing DNP program  Rubella immune status unknown  Planning pregnancy in next year    P:   Reviewed health and wellness pertinent to exam  Continue follow regarding thyroid as indicated  Encouraged to take time for self while finishing program  Lab: [place]  Given pamphlet on preparing for healthy pregnancy. Discussed taking time to settle into career prior trying for pregnancy if she chooses. Start on prenatal vitamins 3 months prior to trying for pregnancy. Questions addressed.  Pap smear: no   counseled on breast self exam, feminine hygiene, adequate intake of calcium and vitamin D, diet and exercise return annually or prn  An After Visit Summary was printed and given to the patient.

## 2020-02-12 ENCOUNTER — Other Ambulatory Visit: Payer: Self-pay

## 2020-02-12 MED FILL — LEVOTHYROXINE 112 MCG TAB: 112 | 30 days supply | Qty: 30 | Fill #6

## 2020-02-13 ENCOUNTER — Ambulatory Visit: Payer: BC Managed Care – PPO | Admitting: Certified Nurse Midwife

## 2020-02-13 ENCOUNTER — Encounter: Payer: Self-pay | Admitting: Certified Nurse Midwife

## 2020-02-13 ENCOUNTER — Other Ambulatory Visit: Payer: Self-pay

## 2020-02-13 VITALS — BP 118/70 | HR 68 | Temp 97.7°F | Resp 16 | Ht 62.75 in | Wt 178.0 lb

## 2020-02-13 DIAGNOSIS — Z789 Other specified health status: Secondary | ICD-10-CM | POA: Diagnosis not present

## 2020-02-13 DIAGNOSIS — Z01419 Encounter for gynecological examination (general) (routine) without abnormal findings: Secondary | ICD-10-CM

## 2020-02-13 NOTE — Patient Instructions (Signed)
EXERCISE AND DIET:  We recommended that you start or continue a regular exercise program for good health. Regular exercise means any activity that makes your heart beat faster and makes you sweat.  We recommend exercising at least 30 minutes per day at least 3 days a week, preferably 4 or 5.  We also recommend a diet low in fat and sugar.  Inactivity, poor dietary choices and obesity can cause diabetes, heart attack, stroke, and kidney damage, among others.    ALCOHOL AND SMOKING:  Women should limit their alcohol intake to no more than 7 drinks/beers/glasses of wine (combined, not each!) per week. Moderation of alcohol intake to this level decreases your risk of breast cancer and liver damage. And of course, no recreational drugs are part of a healthy lifestyle.  And absolutely no smoking or even second hand smoke. Most people know smoking can cause heart and lung diseases, but did you know it also contributes to weakening of your bones? Aging of your skin?  Yellowing of your teeth and nails?  CALCIUM AND VITAMIN D:  Adequate intake of calcium and Vitamin D are recommended.  The recommendations for exact amounts of these supplements seem to change often, but generally speaking 600 mg of calcium (either carbonate or citrate) and 800 units of Vitamin D per day seems prudent. Certain women may benefit from higher intake of Vitamin D.  If you are among these women, your doctor will have told you during your visit.    PAP SMEARS:  Pap smears, to check for cervical cancer or precancers,  have traditionally been done yearly, although recent scientific advances have shown that most women can have pap smears less often.  However, every woman still should have a physical exam from her gynecologist every year. It will include a breast check, inspection of the vulva and vagina to check for abnormal growths or skin changes, a visual exam of the cervix, and then an exam to evaluate the size and shape of the uterus and  ovaries.  And after 37 years of age, a rectal exam is indicated to check for rectal cancers. We will also provide age appropriate advice regarding health maintenance, like when you should have certain vaccines, screening for sexually transmitted diseases, bone density testing, colonoscopy, mammograms, etc.   MAMMOGRAMS:  All women over 40 years old should have a yearly mammogram. Many facilities now offer a "3D" mammogram, which may cost around $50 extra out of pocket. If possible,  we recommend you accept the option to have the 3D mammogram performed.  It both reduces the number of women who will be called back for extra views which then turn out to be normal, and it is better than the routine mammogram at detecting truly abnormal areas.    COLONOSCOPY:  Colonoscopy to screen for colon cancer is recommended for all women at age 50.  We know, you hate the idea of the prep.  We agree, BUT, having colon cancer and not knowing it is worse!!  Colon cancer so often starts as a polyp that can be seen and removed at colonscopy, which can quite literally save your life!  And if your first colonoscopy is normal and you have no family history of colon cancer, most women don't have to have it again for 10 years.  Once every ten years, you can do something that may end up saving your life, right?  We will be happy to help you get it scheduled when you are ready.    Be sure to check your insurance coverage so you understand how much it will cost.  It may be covered as a preventative service at no cost, but you should check your particular policy.      Preparing for Pregnancy If you are considering becoming pregnant, make an appointment to see your regular health care provider to learn how to prepare for a safe and healthy pregnancy (preconception care). During a preconception care visit, your health care provider will:  Do a complete physical exam, including a Pap test.  Take a complete medical history.  Give you  information, answer your questions, and help you resolve problems. Preconception checklist Medical history  Tell your health care provider about any current or past medical conditions. Your pregnancy or your ability to become pregnant may be affected by chronic conditions, such as diabetes, chronic hypertension, and thyroid problems.  Include your family's medical history as well as your partner's medical history.  Tell your health care provider about any history of STIs (sexually transmitted infections).These can affect your pregnancy. In some cases, they can be passed to your baby. Discuss any concerns that you have about STIs.  If indicated, discuss the benefits of genetic testing. This testing will show whether there are any genetic conditions that may be passed from you or your partner to your baby.  Tell your health care provider about: ? Any problems you have had with conception or pregnancy. ? Any medicines you take. These include vitamins, herbal supplements, and over-the-counter medicines. ? Your history of immunizations. Discuss any vaccinations that you may need. Diet  Ask your health care provider what to include in a healthy diet that has a balance of nutrients. This is especially important when you are pregnant or preparing to become pregnant.  Ask your health care provider to help you reach a healthy weight before pregnancy. ? If you are overweight, you may be at higher risk for certain complications, such as high blood pressure, diabetes, and preterm birth. ? If you are underweight, you are more likely to have a baby who has a low birth weight. Lifestyle, work, and home  Let your health care provider know: ? About any lifestyle habits that you have, such as alcohol use, drug use, or smoking. ? About recreational activities that may put you at risk during pregnancy, such as downhill skiing and certain exercise programs. ? Tell your health care provider about any  international travel, especially any travel to places with an active Zika virus outbreak. ? About harmful substances that you may be exposed to at work or at home. These include chemicals, pesticides, radiation, or even litter boxes. ? If you do not feel safe at home. Mental health  Tell your health care provider about: ? Any history of mental health conditions, including feelings of depression, sadness, or anxiety. ? Any medicines that you take for a mental health condition. These include herbs and supplements. Home instructions to prepare for pregnancy Lifestyle   Eat a balanced diet. This includes fresh fruits and vegetables, whole grains, lean meats, low-fat dairy products, healthy fats, and foods that are high in fiber. Ask to meet with a nutritionist or registered dietitian for assistance with meal planning and goals.  Get regular exercise. Try to be active for at least 30 minutes a day on most days of the week. Ask your health care provider which activities are safe during pregnancy.  Do not use any products that contain nicotine or tobacco, such as cigarettes and   e-cigarettes. If you need help quitting, ask your health care provider.  Do not drink alcohol.  Do not take illegal drugs.  Maintain a healthy weight. Ask your health care provider what weight range is right for you. General instructions  Keep an accurate record of your menstrual periods. This makes it easier for your health care provider to determine your baby's due date.  Begin taking prenatal vitamins and folic acid supplements daily as directed by your health care provider.  Manage any chronic conditions, such as high blood pressure and diabetes, as told by your health care provider. This is important. How do I know that I am pregnant? You may be pregnant if you have been sexually active and you miss your period. Symptoms of early pregnancy include:  Mild cramping.  Very light vaginal bleeding  (spotting).  Feeling unusually tired.  Nausea and vomiting (morning sickness). If you have any of these symptoms and you suspect that you might be pregnant, you can take a home pregnancy test. These tests check for a hormone in your urine (human chorionic gonadotropin, or hCG). A woman's body begins to make this hormone during early pregnancy. These tests are very accurate. Wait until at least the first day after you miss your period to take one. If the test shows that you are pregnant (you get a positive result), call your health care provider to make an appointment for prenatal care. What should I do if I become pregnant?      Make an appointment with your health care provider as soon as you suspect you are pregnant.  Do not use any products that contain nicotine, such as cigarettes, chewing tobacco, and e-cigarettes. If you need help quitting, ask your health care provider.  Do not drink alcoholic beverages. Alcohol is related to a number of birth defects.  Avoid toxic odors and chemicals.  You may continue to have sexual intercourse if it does not cause pain or other problems, such as vaginal bleeding. This information is not intended to replace advice given to you by your health care provider. Make sure you discuss any questions you have with your health care provider. Document Revised: 12/02/2017 Document Reviewed: 06/21/2016 Elsevier Patient Education  2020 [employer]  

## 2020-02-14 LAB — RUBELLA SCREEN: Rubella Antibodies, IGG: 6.68 index (ref >0.99)

## 2020-03-01 ENCOUNTER — Encounter: Payer: Self-pay | Admitting: Certified Nurse Midwife

## 2020-03-11 MED FILL — LEVOTHYROXINE 112 MCG TAB: 112 | 30 days supply | Qty: 30 | Fill #7

## 2020-04-15 MED FILL — LEVOTHYROXINE 112 MCG TAB: 112 | 30 days supply | Qty: 30 | Fill #8

## 2020-05-16 MED FILL — LEVOTHYROXINE 112 MCG TAB: 112 | 30 days supply | Qty: 30 | Fill #9

## 2020-05-28 DIAGNOSIS — Z6832 Body mass index (BMI) 32.0-32.9, adult: Secondary | ICD-10-CM | POA: Diagnosis not present

## 2020-05-28 DIAGNOSIS — E7800 Pure hypercholesterolemia, unspecified: Secondary | ICD-10-CM | POA: Diagnosis not present

## 2020-05-28 DIAGNOSIS — J45990 Exercise induced bronchospasm: Secondary | ICD-10-CM | POA: Diagnosis not present

## 2020-05-28 DIAGNOSIS — E039 Hypothyroidism, unspecified: Secondary | ICD-10-CM | POA: Diagnosis not present

## 2020-06-03 MED FILL — LEVOTHYROXINE SODIUM 125 MC: 125 | 90 days supply | Qty: 90 | Fill #0

## 2020-06-06 DIAGNOSIS — H6121 Impacted cerumen, right ear: Secondary | ICD-10-CM | POA: Diagnosis not present

## 2020-06-06 DIAGNOSIS — H6980 Other specified disorders of Eustachian tube, unspecified ear: Secondary | ICD-10-CM | POA: Diagnosis not present

## 2020-06-06 DIAGNOSIS — H9311 Tinnitus, right ear: Secondary | ICD-10-CM | POA: Diagnosis not present

## 2020-08-26 IMAGING — US US THYROID
1 series · 13 of 25 positions shown · non-contrast
Comparison: None.

CLINICAL DATA: Nodule

EXAM:
THYROID ULTRASOUND
TECHNIQUE: Ultrasound examination of the thyroid gland and adjacent soft
tissues was performed.

[Series 1: us thyroid · 0.06mm/px · 64 acquisitions, 13 frames shown]
[im 1/64]
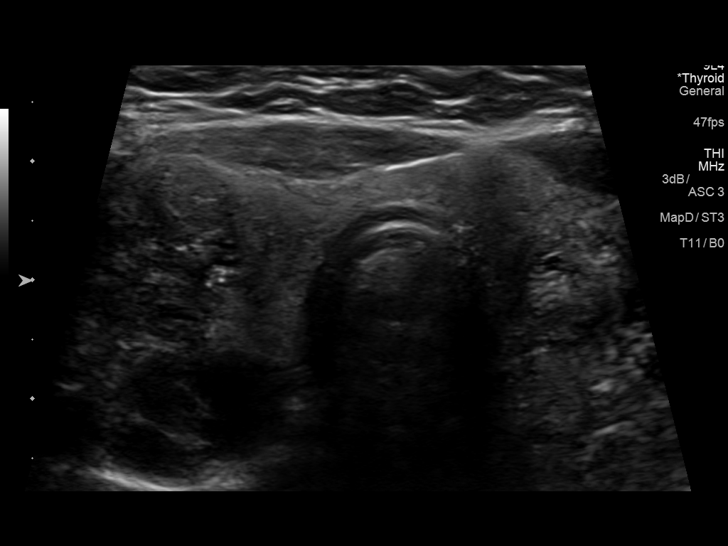
[im 6/64]
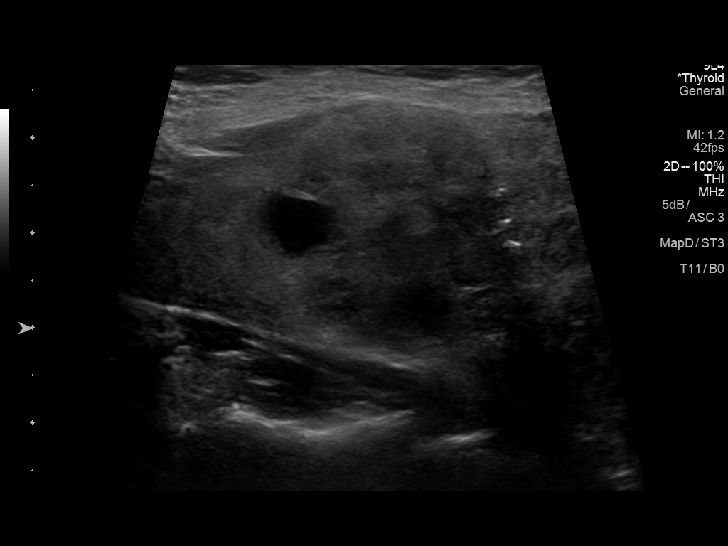
[im 11/64]
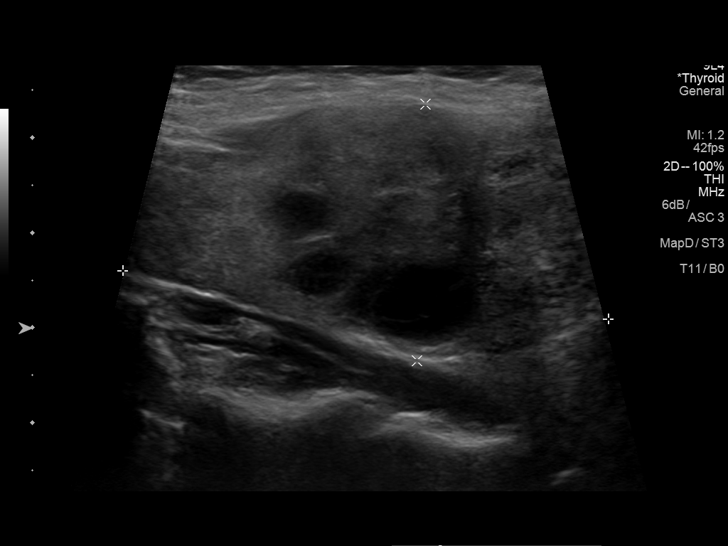
[im 16/64]
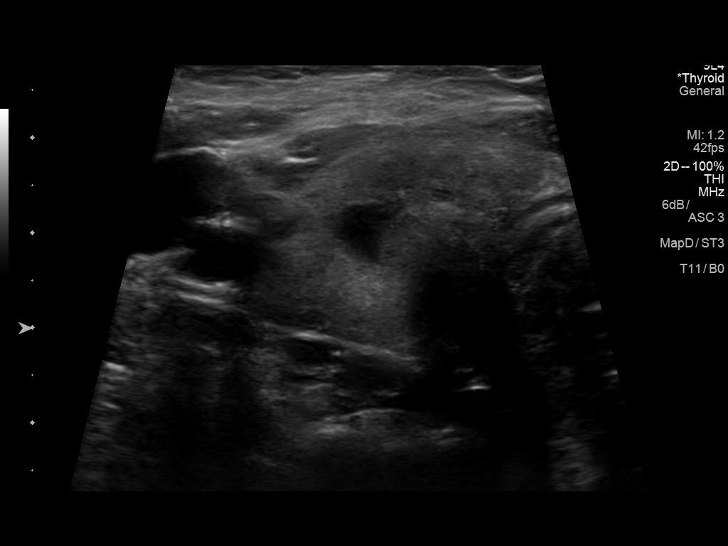
[im 22/64]
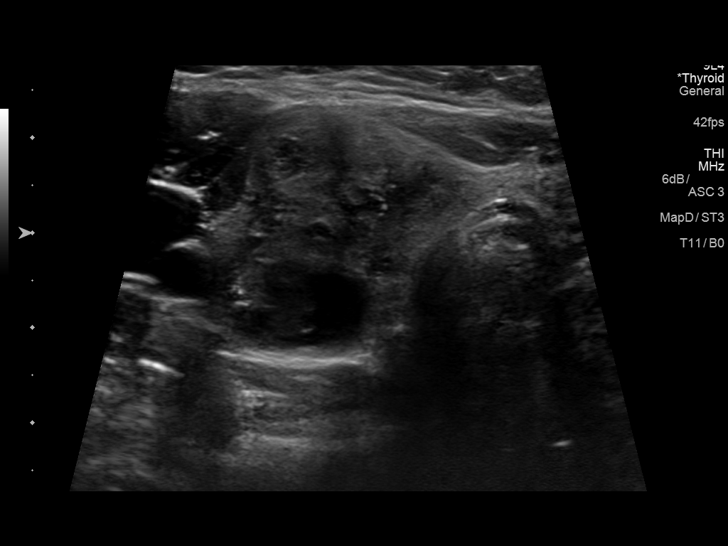
[im 27/64]
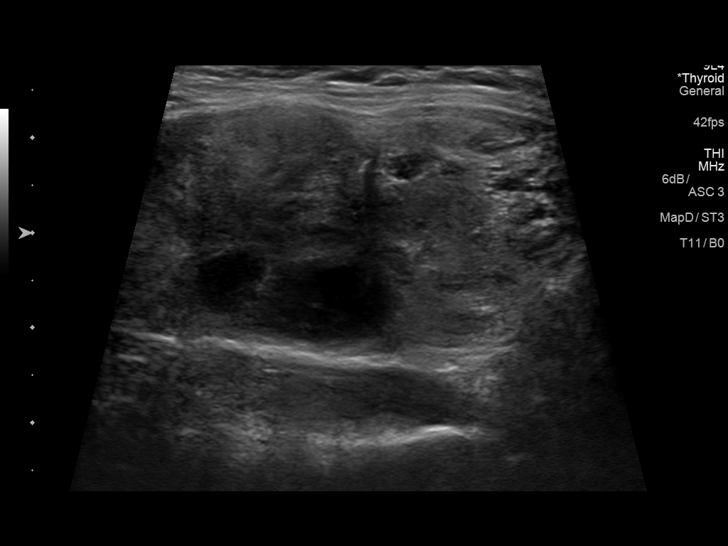
[im 32/64]
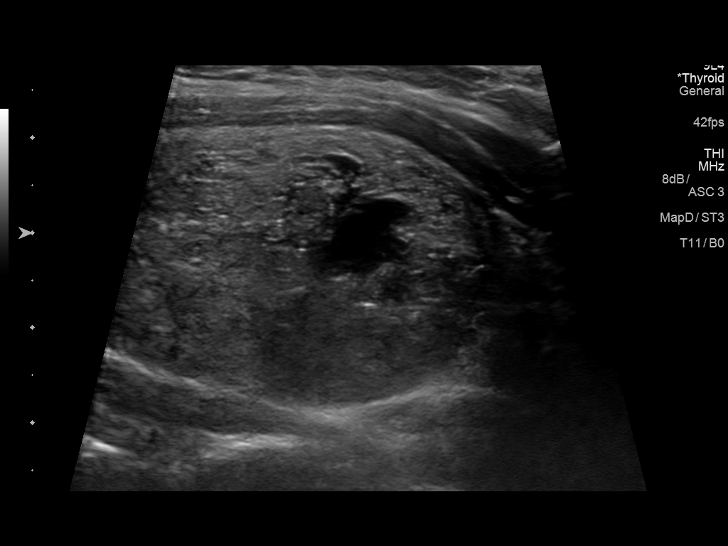
[im 37/64]
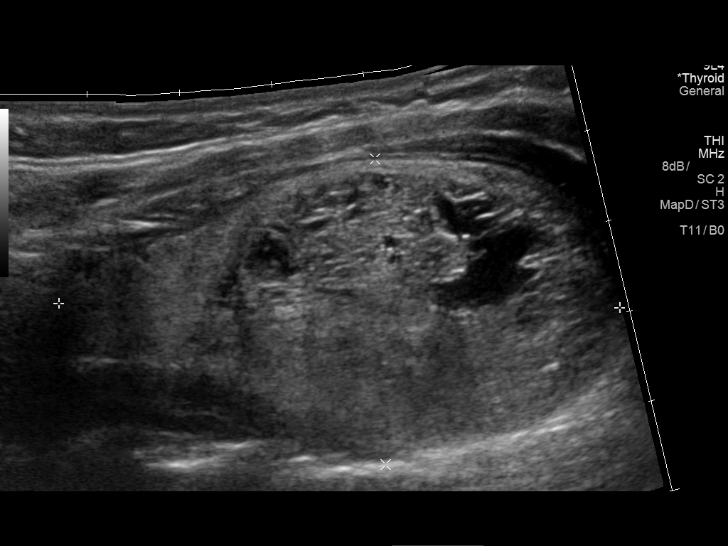
[im 43/64]
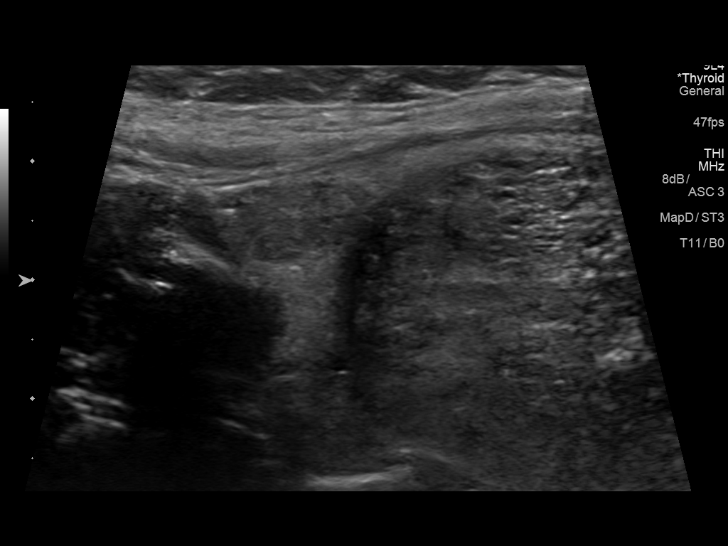
[im 48/64]
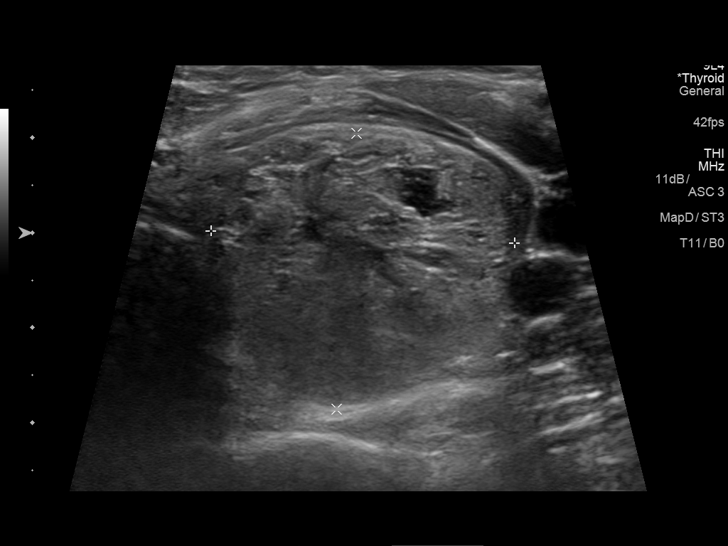
[im 53/64]
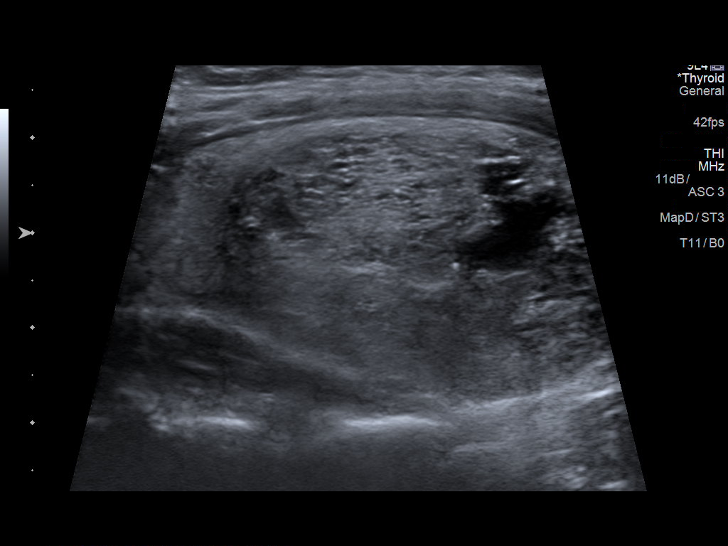
[im 58/64]
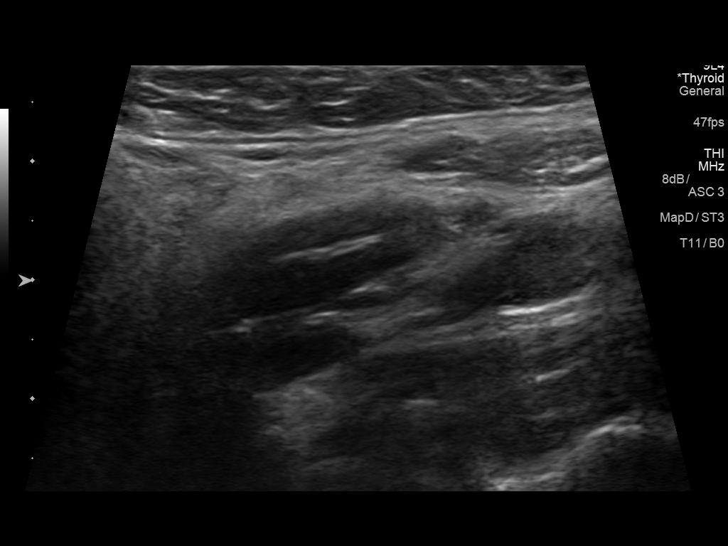
[im 64/64]
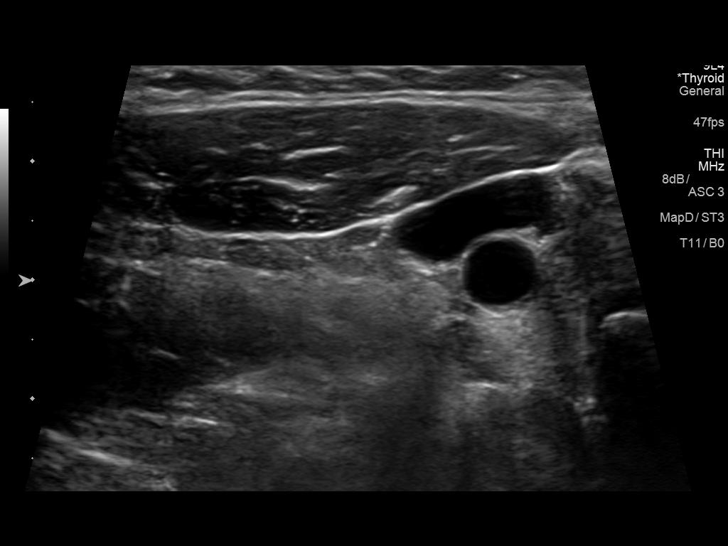

[13 of 25 positions shown; findings below may reference images not displayed]

FINDINGS: Parenchymal Echotexture: Moderately heterogenous

Isthmus: 0.3 cm thickness

Right lobe: 6.2 x 2.7 x 2.8 cm

Left lobe: 6 x 3.3 x 3.1 cm

_________________________________________________________

Estimated total number of nodules >/= 1 cm: 3

Number of spongiform nodules >/=  2 cm not described below (TR1): 0

Number of mixed cystic and solid nodules >/= 1.5 cm not described
below (TR2): 0

_________________________________________________________

Nodule # 1:

Location: Right; Mid

Maximum size: 4.5 cm; Other 2 dimensions: 2.5 x 2.5 cm

Composition: solid/almost completely solid (2)

Echogenicity: hypoechoic (2)

Shape: taller-than-wide (3)

Margins: ill-defined (0)

Echogenic foci: none (0)

ACR TI-RADS total points: 7.

ACR TI-RADS risk category: TR5 (>/= 7 points).

ACR TI-RADS recommendations:

**Given size (>/= 1.0 cm) and appearance, fine needle aspiration of
this highly suspicious nodule should be considered based on TI-RADS
criteria.

_________________________________________________________

Nodule # 2:

Location: Left; Superior

Maximum size: 1.2 cm; Other 2 dimensions: 1 x 0.6 cm

Composition: solid/almost completely solid (2)

Echogenicity: hypoechoic (2)

Shape: not taller-than-wide (0)

Margins: smooth (0)

Echogenic foci: none (0)

ACR TI-RADS total points: 4.

ACR TI-RADS risk category: TR4 (4-6 points).

ACR TI-RADS recommendations:

*Given size (>/= 1 - 1.4 cm) and appearance, a follow-up ultrasound
in 1 year should be considered based on TI-RADS criteria.

_________________________________________________________

Nodule # 3:

Location: Left; Inferior

Maximum size: 4.7 cm; Other 2 dimensions: 3.2 x 2  cm

Composition: mixed cystic and solid (1)

Echogenicity: isoechoic (1)

Shape: not taller-than-wide (0)

Margins: ill-defined (0)

Echogenic foci: none (0)

ACR TI-RADS total points: 2.

ACR TI-RADS risk category: TR2 (2 points).

ACR TI-RADS recommendations:

This nodule does NOT meet TI-RADS criteria for biopsy or dedicated
follow-up.

_________________________________________________________
IMPRESSION: 1. Thyromegaly with bilateral nodules.
2. Recommend FNA biopsy of 4.5 cm suspicious right mid nodule.
3. Recommend annual/biennial ultrasound follow-up left nodule as
above, until stability x5 years confirmed.

The above is in keeping with the ACR TI-RADS recommendations - [HOSPITAL] 7399;[DATE].

## 2020-08-27 ENCOUNTER — Other Ambulatory Visit (HOSPITAL_COMMUNITY): Payer: Self-pay | Admitting: Family Medicine

## 2020-08-27 MED FILL — LEVOTHYROXINE SODIUM 125 MC: 125 | 90 days supply | Qty: 90 | Fill #0

## 2020-09-23 DIAGNOSIS — J45990 Exercise induced bronchospasm: Secondary | ICD-10-CM | POA: Diagnosis not present

## 2020-09-23 DIAGNOSIS — Z6832 Body mass index (BMI) 32.0-32.9, adult: Secondary | ICD-10-CM | POA: Diagnosis not present

## 2020-09-23 DIAGNOSIS — Z0000 Encounter for general adult medical examination without abnormal findings: Secondary | ICD-10-CM | POA: Diagnosis not present

## 2020-09-23 DIAGNOSIS — E039 Hypothyroidism, unspecified: Secondary | ICD-10-CM | POA: Diagnosis not present

## 2020-09-23 DIAGNOSIS — E7800 Pure hypercholesterolemia, unspecified: Secondary | ICD-10-CM | POA: Diagnosis not present

## 2020-09-29 IMAGING — US US FNA BIOPSY THYROID 1ST LESION
1 series · 13 of 18 positions shown · non-contrast
Comparison: Thyroid ultrasound dated 12/08/2018

MEDICATIONS:
None

COMPLICATIONS:
None immediate.

INDICATION: Patient with history of palpable thyroid nodule and follow-up
thyroid ultrasound on 12/08/2018 which revealed bilateral thyroid
nodules with a 4.5 cm right mid nodule which meets criteria for
biopsy. She presents today for needle aspirate biopsy of this right
thyroid nodule.

EXAM:
ULTRASOUND GUIDED FINE NEEDLE ASPIRATION BIOPSY OF RIGHT MID THYROID
NODULE
TECHNIQUE: Informed written consent was obtained from the patient after a
discussion of the risks, benefits and alternatives to treatment.
Questions regarding the procedure were encouraged and answered. A
timeout was performed prior to the initiation of the procedure.

[Series 1: us fna biopsy thyroid 1st lesion · 0.06mm/px · 18 acquisitions, 13 frames shown]
[im 1/18]
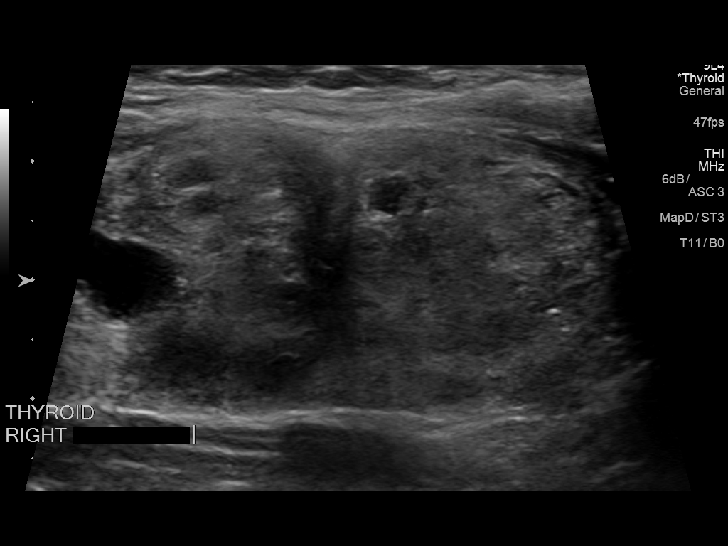
[im 3/18]
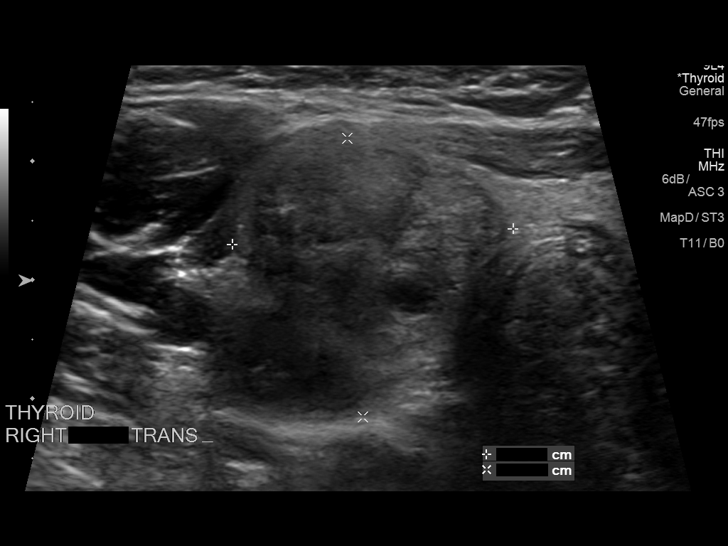
[im 4/18]
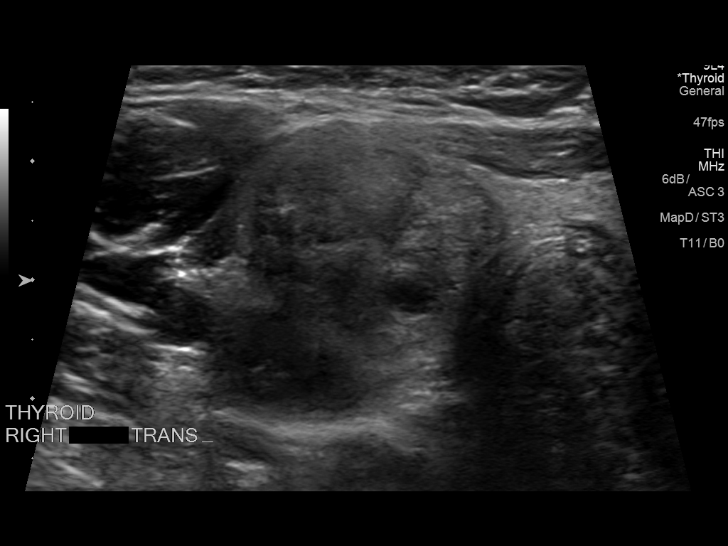
[im 5/18]
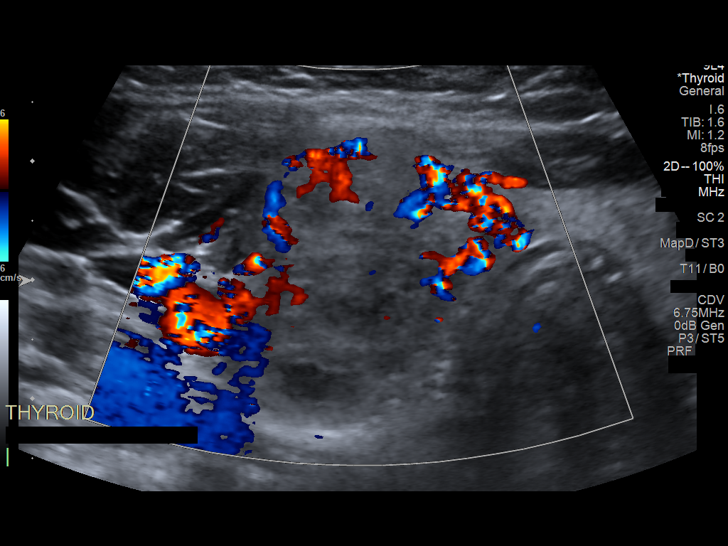
[im 7/18]
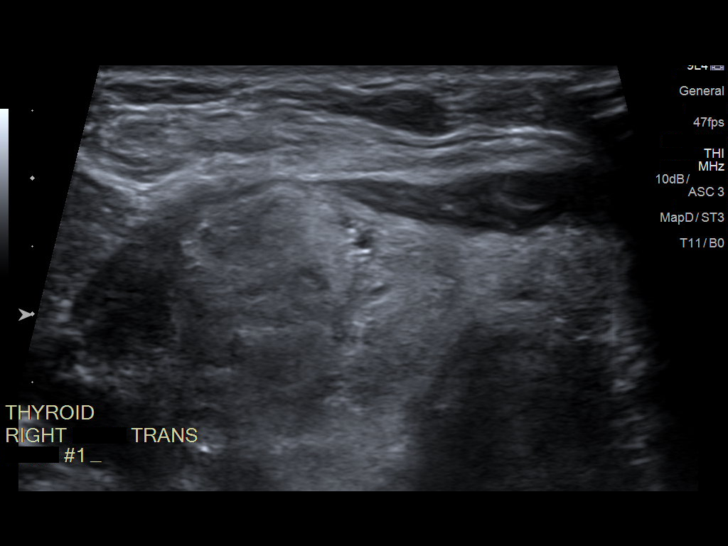
[im 8/18]
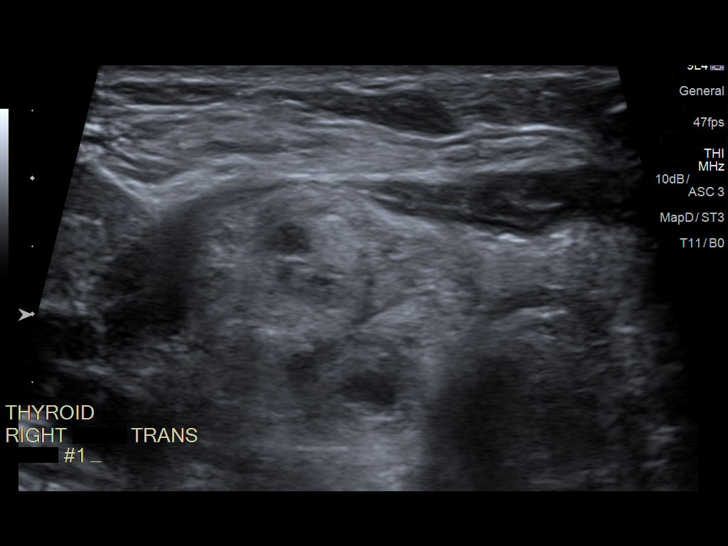
[im 10/18]
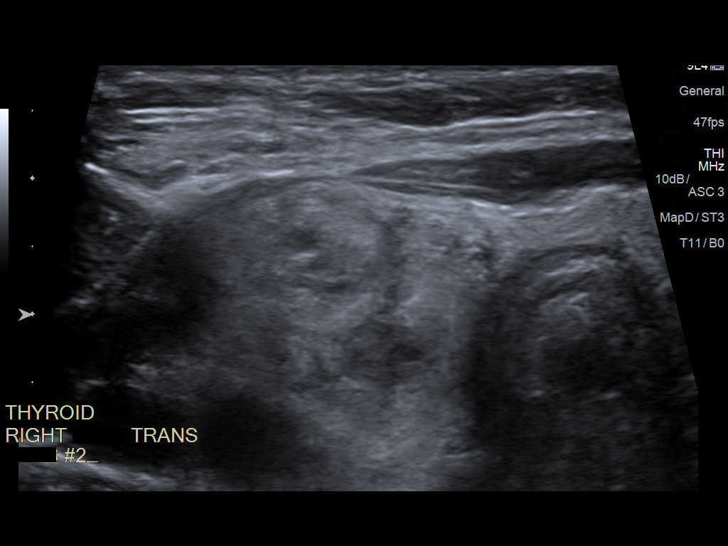
[im 11/18]
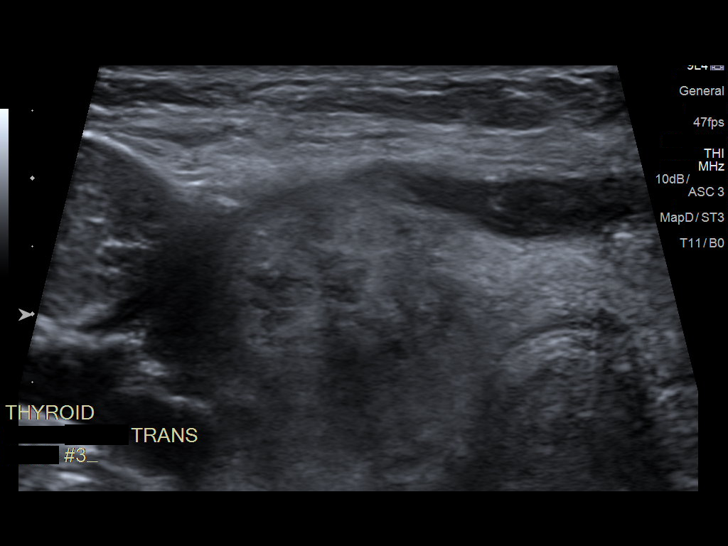
[im 12/18]
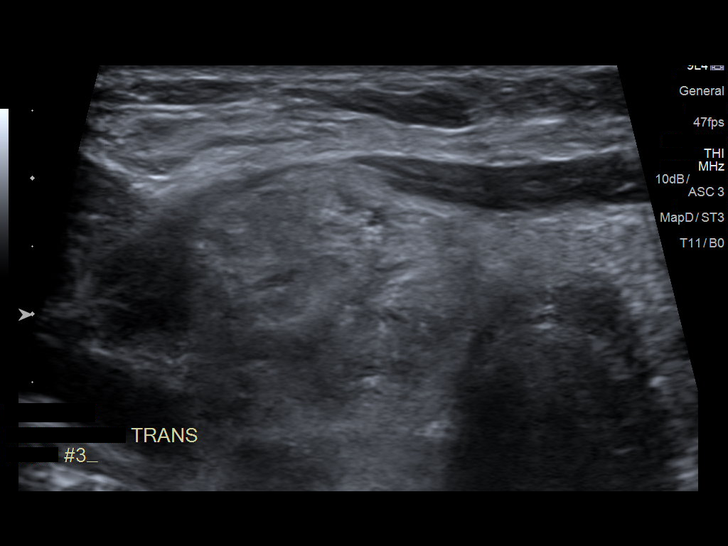
[im 14/18]
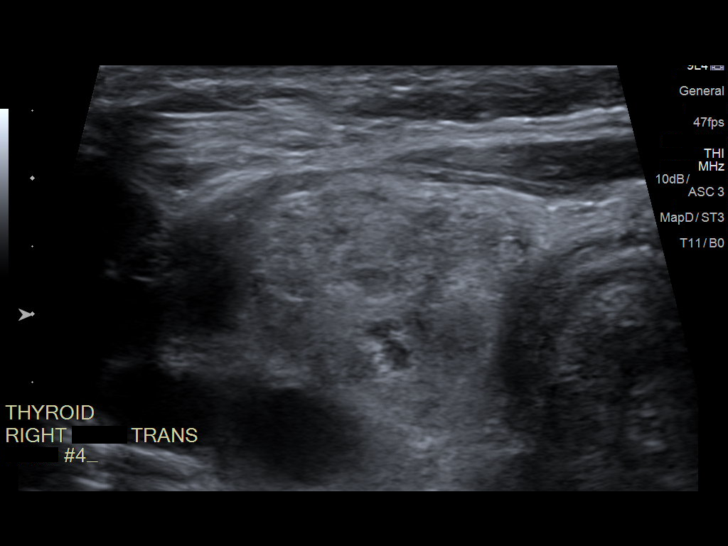
[im 15/18]
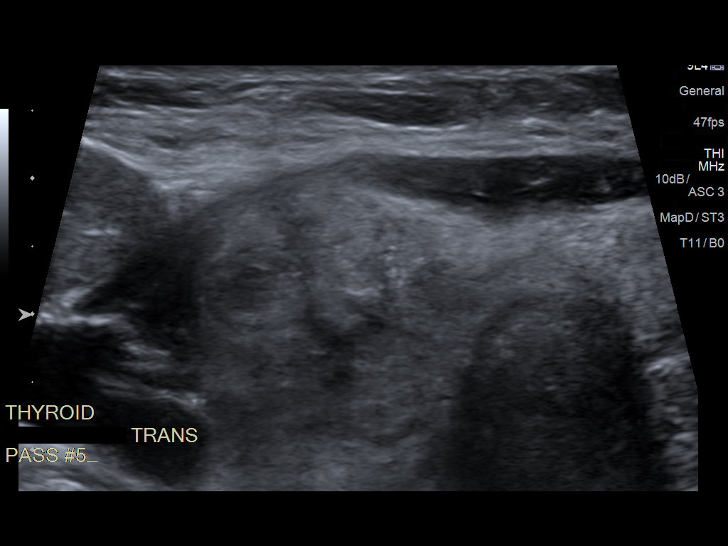
[im 16/18]
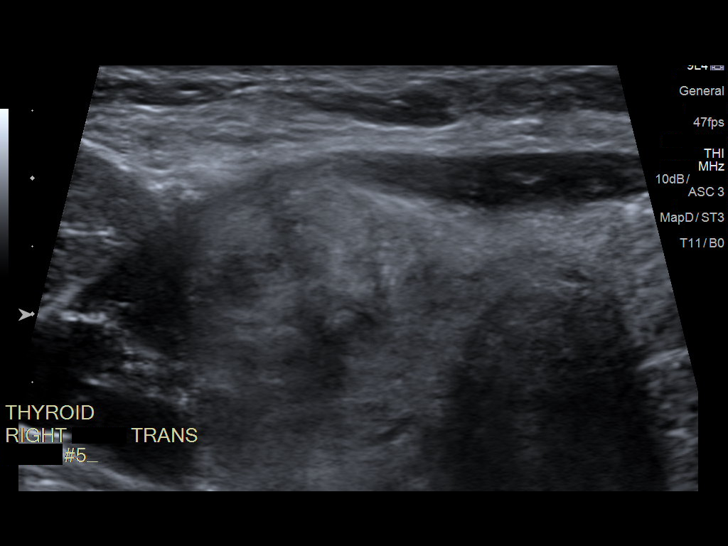
[im 18/18]
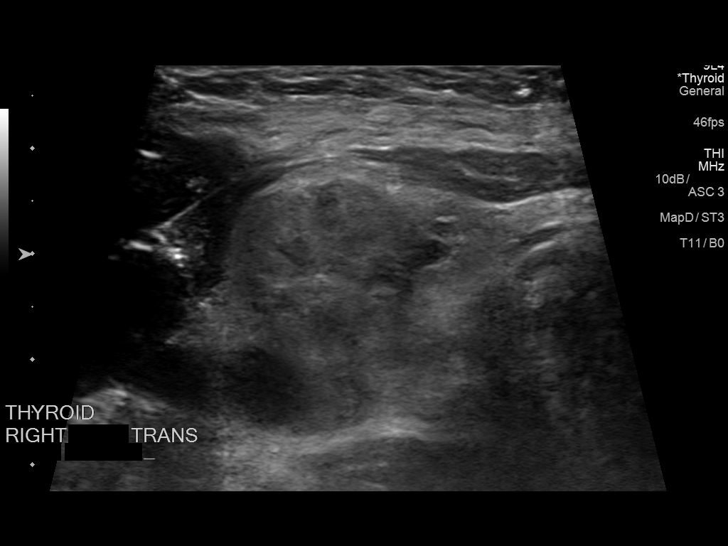

[13 of 18 positions shown; findings below may reference images not displayed]

Pre-procedural ultrasound scanning demonstrated unchanged size and
appearance of the indeterminate nodule within the right mid thyroid
region

The procedure was planned. The neck was prepped in the usual sterile
fashion, and a sterile drape was applied covering the operative
field. A timeout was performed prior to the initiation of the
procedure. Local anesthesia was provided with 1% lidocaine.

Under direct ultrasound guidance, 5 FNA biopsies were performed of
the right mid thyroid nodule with 25 gauge needles. Multiple
ultrasound images were saved for procedural documentation purposes.
The samples were prepared and submitted to pathology as well as for
Afirma testing.

Limited post procedural scanning was negative for hematoma or
additional complication. Dressings were placed. The patient
tolerated the above procedures procedure well without immediate
postprocedural complication.
FINDINGS: Nodule reference number based on prior diagnostic ultrasound: 1

Maximum size: 4.5 cm

Location: Right; Mid

ACR TI-RADS risk category: TR5 (>/= 7 points)

Reason for biopsy: meets ACR TI-RADS criteria

Ultrasound imaging confirms appropriate placement of the needles
within the thyroid nodule.
IMPRESSION: Technically successful ultrasound guided fine needle aspiration
biopsy of right mid thyroid nodule. Final pathology pending.

## 2020-12-02 MED FILL — LEVOTHYROXINE SODIUM 125 MC: 125 | 90 days supply | Qty: 90 | Fill #0

## 2020-12-11 MED FILL — LEVOTHYROXINE SODIUM 125 MC: 125 | 90 days supply | Qty: 90 | Fill #0

## 2021-02-21 ENCOUNTER — Ambulatory Visit: Payer: BC Managed Care – PPO | Admitting: Nurse Practitioner

## 2021-03-10 MED FILL — LEVOTHYROXINE SODIUM 125 MC: 125 | 90 days supply | Qty: 90 | Fill #1

## 2021-03-21 ENCOUNTER — Other Ambulatory Visit (HOSPITAL_COMMUNITY): Payer: Self-pay

## 2021-03-21 DIAGNOSIS — E039 Hypothyroidism, unspecified: Secondary | ICD-10-CM | POA: Diagnosis not present

## 2021-03-21 DIAGNOSIS — E7800 Pure hypercholesterolemia, unspecified: Secondary | ICD-10-CM | POA: Diagnosis not present

## 2021-03-21 DIAGNOSIS — J45990 Exercise induced bronchospasm: Secondary | ICD-10-CM | POA: Diagnosis not present

## 2021-03-21 MED ORDER — LEVOTHYROXINE SODIUM 125 MCG PO TABS
ORAL_TABLET | ORAL | 3 refills | Status: DC
  Filled 2021-03-21 – 2021-07-02 (×2): qty 90, 90d supply, fill #0
  Filled 2021-09-29: qty 90, 90d supply, fill #1
  Filled 2022-01-12: qty 90, 90d supply, fill #2

## 2021-03-21 NOTE — Progress Notes (Signed)
40 y.o. G59P0020 Married White or Caucasian female here for annual exam.  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 Feels happy.  Step Father passed January Mother is moving to the area  Will not be preventing pregnancy this year, if it happens, it happens    Patient's last menstrual period was 02/25/2021 (exact date).            Sexually active: Yes.    The current method of family planning is none.    Exercising: Yes.    yardwork & cardio Smoker:  no  Health Maintenance: Pap:  02-08-2019 neg HPV HR neg History of abnormal Pap:  no MMG:  none Colonoscopy:  none BMD:   none Gardasil:  Not done Covid-19: pfizer Hep C testing: neg in the past per patient   reports that she quit smoking about 12 years ago. Her smoking use included cigarettes. She quit after 10.00 years of use. She has never used smokeless tobacco. She reports current alcohol use of about 4.0 standard drinks of alcohol per week. She reports that she does not use drugs.  Past Medical History:  Diagnosis Date  . Exercise-induced asthma   . GERD (gastroesophageal reflux disease)   . History of anemia teen  . History of ectopic pregnancy 08/2017   s/p  right salpingectomy  . Multiple thyroid nodules   . Seasonal allergies   . Sinus drainage   . Thyroid neoplasm   . Wears contact lenses     Past Surgical History:  Procedure Laterality Date  . BIOPSY Right 08/31/2014   Procedure: BIOPSY right peritoneal side wall ;  Surgeon: Jamey Reas de Berton Lan, MD;  Location: [facility];  Service: Gynecology;  Laterality: Right;  . DILATION AND EVACUATION N/A 08/31/2014   Procedure: DILATATION AND EVACUATION, FROZEN SECTION;  Surgeon: Everardo All Amundson de Berton Lan, MD;  Location: [facility];  Service: Gynecology;  Laterality: N/A;  . LAPAROSCOPIC OVARIAN CYSTECTOMY Right 08/31/2014   Procedure: LAPAROSCOPIC bilateral  OVARIAN CYSTs drainage  drainage of right  tubal cyst;  Surgeon: Jamey Reas de  Berton Lan, MD;  Location: [facility];  Service: Gynecology;  Laterality: Right;  . LAPAROSCOPY N/A 08/31/2014   Procedure:  LAPAROSCOPY OPERATIVE  right salpinostomy ;  Surgeon: Jamey Reas de Berton Lan, MD;  Location: [facility];  Service: Gynecology;  Laterality: N/A;  . THYROIDECTOMY N/A 03/09/2019   Procedure: TOTAL THYROIDECTOMY;  Surgeon: Armandina Gemma, MD;  Location: [facility];  Service: General;  Laterality: N/A;  . TYMPANOSTOMY TUBE PLACEMENT Bilateral child    Current Outpatient Medications  Medication Sig Dispense Refill  . albuterol (PROVENTIL HFA;VENTOLIN HFA) 108 (90 BASE) MCG/ACT inhaler Inhale 1-2 puffs into the lungs every 6 (six) hours as needed for wheezing or shortness of breath.     . Ascorbic Acid (VITAMIN C PO) Take 500 mg by mouth.    . B Complex Vitamins (B COMPLEX PO) Take by mouth.    . calcium carbonate (TUMS - DOSED IN MG ELEMENTAL CALCIUM) 500 MG chewable tablet Chew 1 tablet by mouth as needed for indigestion or heartburn.    . levocetirizine (XYZAL) 5 MG tablet Take 5 mg by mouth every evening.    Marland Kitchen levothyroxine (SYNTHROID) 125 MCG tablet Take 1 tablet by mouth daily before breakfast.    . naproxen sodium (ALEVE) 220 MG tablet Take 220 mg by mouth daily as needed (pain).    . Probiotic Product (PROBIOTIC PO)  Take 1 capsule by mouth daily.     . Red Yeast Rice 600 MG TABS Take 1,200 mg by mouth 2 (two) times daily.     Marland Kitchen VITAMIN D PO Take 5,000 Int'l Units by mouth.     No current facility-administered medications for this visit.    Family History  Problem Relation Age of Onset  . Hyperlipidemia Father   . Hypertension Father   . Diabetes Father   . Hyperlipidemia Mother   . Osteopenia Mother   . Diabetes Paternal Grandfather   . Hypertension Paternal Grandfather     Review of Systems  Constitutional: Negative.   HENT: Negative.   Eyes: Negative.   Respiratory: Negative.   Cardiovascular: Negative.   Gastrointestinal: Negative.    Endocrine: Negative.   Genitourinary: Negative.   Musculoskeletal: Negative.   Skin: Negative.   Allergic/Immunologic: Negative.   Neurological: Negative.   Hematological: Negative.   Psychiatric/Behavioral: Negative.     Exam:   BP 114/78   Pulse 74   Resp 16   Ht 5' 2.25" (1.581 m)   Wt 171 lb (77.6 kg)   LMP 02/25/2021 (Exact Date)   BMI 31.03 kg/m   Height: 5' 2.25" (158.1 cm)  General appearance: alert, cooperative and appears stated age, no acute distress Head: Normocephalic, without obvious abnormality Neck: no adenopathy, thyroid non-palpable  Lungs: clear to auscultation bilaterally Breasts: No axillary or supraclavicular adenopathy, Normal to palpation without dominant masses Heart: regular rate and rhythm Abdomen: soft, non-tender; no masses,  no organomegaly Extremities: extremities normal, no edema Skin: No rashes or lesions Lymph nodes: Cervical, supraclavicular, and axillary nodes normal. No abnormal inguinal nodes palpated Neurologic: Grossly normal   Pelvic: External genitalia:  no lesions              Urethra:  normal appearing urethra with no masses, tenderness or lesions              Bartholins and Skenes: normal                 Vagina: normal appearing vagina, appropriate for age, normal appearing discharge, no lesions              Cervix: neg cervical motion tenderness, no visible lesions             Bimanual Exam:   Uterus:  normal size, contour, position, consistency, mobility, non-tender              Adnexa: no mass, fullness, tenderness                 A:  Well Woman with normal exam  Considering pregnancy this year  P:   Pap : pap and HPV test due 2025  Mammogram: start 2024 or sooner if needed  Labs: not indicated today  Medications: Prenatal Vitamin with DHA

## 2021-03-24 ENCOUNTER — Encounter: Payer: Self-pay | Admitting: Nurse Practitioner

## 2021-03-24 ENCOUNTER — Other Ambulatory Visit: Payer: Self-pay

## 2021-03-24 ENCOUNTER — Ambulatory Visit (INDEPENDENT_AMBULATORY_CARE_PROVIDER_SITE_OTHER): Payer: 59 | Admitting: Nurse Practitioner

## 2021-03-24 ENCOUNTER — Other Ambulatory Visit (HOSPITAL_COMMUNITY): Payer: Self-pay

## 2021-03-24 ENCOUNTER — Ambulatory Visit: Payer: Self-pay | Admitting: Nurse Practitioner

## 2021-03-24 VITALS — BP 114/78 | HR 74 | Resp 16 | Ht 62.25 in | Wt 171.0 lb

## 2021-03-24 DIAGNOSIS — Z01419 Encounter for gynecological examination (general) (routine) without abnormal findings: Secondary | ICD-10-CM

## 2021-03-24 MED ORDER — PRENATAL VITAMIN/MIN +DHA 27-0.8-200 MG PO CAPS
1.0000 | ORAL_CAPSULE | Freq: Every day | ORAL | 4 refills | Status: DC
  Filled 2021-03-24: qty 90, fill #0

## 2021-03-24 NOTE — Patient Instructions (Addendum)
NICE TO SEE YOU!! Glad to hear you are doing well! You look great!   Pap : pap and HPV test due 2025  Mammogram: start 2024 or sooner if needed  Labs: not indicated today  Medications: Prenatal Vitamin with DHA   Follow up in 1 year  Health Maintenance, Female Adopting a healthy lifestyle and getting preventive care are important in promoting health and wellness. Ask your health care provider about:  The right schedule for you to have regular tests and exams.  Things you can do on your own to prevent diseases and keep yourself healthy. What should I know about diet, weight, and exercise? Eat a healthy diet  Eat a diet that includes plenty of vegetables, fruits, low-fat dairy products, and lean protein.  Do not eat a lot of foods that are high in solid fats, added sugars, or sodium.   Maintain a healthy weight Body mass index (BMI) is used to identify weight problems. It estimates body fat based on height and weight. Your health care provider can help determine your BMI and help you achieve or maintain a healthy weight. Get regular exercise Get regular exercise. This is one of the most important things you can do for your health. Most adults should:  Exercise for at least 150 minutes each week. The exercise should increase your heart rate and make you sweat (moderate-intensity exercise).  Do strengthening exercises at least twice a week. This is in addition to the moderate-intensity exercise.  Spend less time sitting. Even light physical activity can be beneficial. Watch cholesterol and blood lipids Have your blood tested for lipids and cholesterol at 38 years of age, then have this test every 5 years. Have your cholesterol levels checked more often if:  Your lipid or cholesterol levels are high.  You are older than 39 years of age.  You are at high risk for heart disease. What should I know about cancer screening? Depending on your health history and family history, you may  need to have cancer screening at various ages. This may include screening for:  Breast cancer.  Cervical cancer.  Colorectal cancer.  Skin cancer.  Lung cancer. What should I know about heart disease, diabetes, and high blood pressure? Blood pressure and heart disease  High blood pressure causes heart disease and increases the risk of stroke. This is more likely to develop in people who have high blood pressure readings, are of African descent, or are overweight.  Have your blood pressure checked: ? Every 3-5 years if you are 27-86 years of age. ? Every year if you are 56 years old or older. Diabetes Have regular diabetes screenings. This checks your fasting blood sugar level. Have the screening done:  Once every three years after age 29 if you are at a normal weight and have a low risk for diabetes.  More often and at a younger age if you are overweight or have a high risk for diabetes. What should I know about preventing infection? Hepatitis B If you have a higher risk for hepatitis B, you should be screened for this virus. Talk with your health care provider to find out if you are at risk for hepatitis B infection. Hepatitis C Testing is recommended for:  Everyone born from [birth year] through 1963.  Anyone with known risk factors for hepatitis C. Sexually transmitted infections (STIs)  Get screened for STIs, including gonorrhea and chlamydia, if: ? You are sexually active and are younger than 38 years of age. ?  You are older than 38 years of age and your health care provider tells you that you are at risk for this type of infection. ? Your sexual activity has changed since you were last screened, and you are at increased risk for chlamydia or gonorrhea. Ask your health care provider if you are at risk.  Ask your health care provider about whether you are at high risk for HIV. Your health care provider may recommend a prescription medicine to help prevent HIV infection. If you  choose to take medicine to prevent HIV, you should first get tested for HIV. You should then be tested every 3 months for as long as you are taking the medicine. Pregnancy  If you are about to stop having your period (premenopausal) and you may become pregnant, seek counseling before you get pregnant.  Take 400 to 800 micrograms (mcg) of folic acid every day if you become pregnant.  Ask for birth control (contraception) if you want to prevent pregnancy. Osteoporosis and menopause Osteoporosis is a disease in which the bones lose minerals and strength with aging. This can result in bone fractures. If you are 69 years old or older, or if you are at risk for osteoporosis and fractures, ask your health care provider if you should:  Be screened for bone loss.  Take a calcium or vitamin D supplement to lower your risk of fractures.  Be given hormone replacement therapy (HRT) to treat symptoms of menopause. Follow these instructions at home: Lifestyle  Do not use any products that contain nicotine or tobacco, such as cigarettes, e-cigarettes, and chewing tobacco. If you need help quitting, ask your health care provider.  Do not use street drugs.  Do not share needles.  Ask your health care provider for help if you need support or information about quitting drugs. Alcohol use  Do not drink alcohol if: ? Your health care provider tells you not to drink. ? You are pregnant, may be pregnant, or are planning to become pregnant.  If you drink alcohol: ? Limit how much you use to 0-1 drink a day. ? Limit intake if you are breastfeeding.  Be aware of how much alcohol is in your drink. In the [place] one drink equals one 12 oz bottle of beer (355 mL), one 5 oz glass of wine (148 mL), or one 1 oz glass of hard liquor (44 mL). General instructions  Schedule regular health, dental, and eye exams.  Stay current with your vaccines.  Tell your health care provider if: ? You often feel  depressed. ? You have ever been abused or do not feel safe at home. Summary  Adopting a healthy lifestyle and getting preventive care are important in promoting health and wellness.  Follow your health care provider's instructions about healthy diet, exercising, and getting tested or screened for diseases.  Follow your health care provider's instructions on monitoring your cholesterol and blood pressure. This information is not intended to replace advice given to you by your health care provider. Make sure you discuss any questions you have with your health care provider. Document Revised: 11/23/2018 Document Reviewed: 11/23/2018 Elsevier Patient Education  2021 [employer].

## 2021-03-25 ENCOUNTER — Other Ambulatory Visit (HOSPITAL_COMMUNITY): Payer: Self-pay

## 2021-06-30 ENCOUNTER — Other Ambulatory Visit (HOSPITAL_COMMUNITY): Payer: Self-pay

## 2021-07-01 ENCOUNTER — Other Ambulatory Visit (HOSPITAL_COMMUNITY): Payer: Self-pay

## 2021-07-02 ENCOUNTER — Other Ambulatory Visit (HOSPITAL_COMMUNITY): Payer: Self-pay

## 2021-09-30 ENCOUNTER — Other Ambulatory Visit (HOSPITAL_COMMUNITY): Payer: Self-pay

## 2021-10-21 DIAGNOSIS — Z6831 Body mass index (BMI) 31.0-31.9, adult: Secondary | ICD-10-CM | POA: Diagnosis not present

## 2021-10-21 DIAGNOSIS — E039 Hypothyroidism, unspecified: Secondary | ICD-10-CM | POA: Diagnosis not present

## 2021-10-21 DIAGNOSIS — E7800 Pure hypercholesterolemia, unspecified: Secondary | ICD-10-CM | POA: Diagnosis not present

## 2022-01-13 ENCOUNTER — Other Ambulatory Visit (HOSPITAL_COMMUNITY): Payer: Self-pay

## 2022-01-26 DIAGNOSIS — E7800 Pure hypercholesterolemia, unspecified: Secondary | ICD-10-CM | POA: Diagnosis not present

## 2022-01-26 DIAGNOSIS — Z0000 Encounter for general adult medical examination without abnormal findings: Secondary | ICD-10-CM | POA: Diagnosis not present

## 2022-01-26 DIAGNOSIS — E039 Hypothyroidism, unspecified: Secondary | ICD-10-CM | POA: Diagnosis not present

## 2022-01-26 DIAGNOSIS — Z6831 Body mass index (BMI) 31.0-31.9, adult: Secondary | ICD-10-CM | POA: Diagnosis not present

## 2022-03-26 ENCOUNTER — Ambulatory Visit: Payer: 59 | Admitting: Obstetrics and Gynecology

## 2022-03-26 ENCOUNTER — Ambulatory Visit: Payer: 59 | Admitting: Nurse Practitioner

## 2022-04-01 NOTE — Progress Notes (Signed)
41 y.o. G70P0020 Married Caucasian female here for annual exam.   ? ?Not preventing pregnancy.  ?Trying for about 5 months.   ?Would welcome pregnancy. ?Wants to let things happen naturally.  ? ?Periods are regular.  ? ?Rubella immune 02/13/20.  ? ?Working on weight loss.  ? ?Finished grad school. ?Working as a Designer, jewellery at [employer].  ? ?PCP:  Waldron Labs, MD  ? ?Patient's last menstrual period was 04/02/2022 (exact date).     ?Period Cycle (Days): 30 ?Period Duration (Days): 3-4 ?Period Pattern: Regular ?Menstrual Flow:  (first 2 days heavy then tapers) ?Menstrual Control: Tampon, Maxi pad ?Menstrual Control Change Freq (Hours): changes pad every 4-5 hours on heavy days ?Dysmenorrhea: None ?    ?Sexually active: Yes.    ?The current method of family planning is none.    ?Exercising: Yes.     Hiking, walking ?Smoker:  no ? ?Health Maintenance: ?Pap:  02-08-19 Neg:Neg HR HPV, 01-21-17 Neg, 01-02-16 Neg ?History of abnormal Pap:  no ?MMG:  n/a ?Colonoscopy:  n/a ?BMD:   n/a  Result  n/a ?TDaP:  04-27-17 ?Gardasil:   no ?HIV:Neg in the past ?Hep C:Neg in the past ?Screening Labs: PCP. ? ? reports that she quit smoking about 13 years ago. Her smoking use included cigarettes. She has never used smokeless tobacco. She reports current alcohol use of about 4.0 standard drinks per week. She reports that she does not use drugs. ? ?Past Medical History:  ?Diagnosis Date  ? Exercise-induced asthma   ? GERD (gastroesophageal reflux disease)   ? History of anemia teen  ? History of ectopic pregnancy 08/2017  ? s/p  right salpingectomy  ? Multiple thyroid nodules   ? Seasonal allergies   ? Sinus drainage   ? Thyroid neoplasm   ? Wears contact lenses   ? ? ?Past Surgical History:  ?Procedure Laterality Date  ? BIOPSY Right 08/31/2014  ? Procedure: BIOPSY right peritoneal side wall ;  Surgeon: Jamey Reas de Berton Lan, MD;  Location: [facility];  Service: Gynecology;  Laterality: Right;  ? DILATION AND  EVACUATION N/A 08/31/2014  ? Procedure: DILATATION AND EVACUATION, FROZEN SECTION;  Surgeon: Everardo All Amundson de Berton Lan, MD;  Location: [facility];  Service: Gynecology;  Laterality: N/A;  ? LAPAROSCOPIC OVARIAN CYSTECTOMY Right 08/31/2014  ? Procedure: LAPAROSCOPIC bilateral  OVARIAN CYSTs drainage  drainage of right  tubal cyst;  Surgeon: Jamey Reas de Berton Lan, MD;  Location: [facility];  Service: Gynecology;  Laterality: Right;  ? LAPAROSCOPY N/A 08/31/2014  ? Procedure:  LAPAROSCOPY OPERATIVE  right salpinostomy ;  Surgeon: Jamey Reas de Berton Lan, MD;  Location: [facility];  Service: Gynecology;  Laterality: N/A;  ? THYROIDECTOMY N/A 03/09/2019  ? Procedure: TOTAL THYROIDECTOMY;  Surgeon: Armandina Gemma, MD;  Location: [facility];  Service: General;  Laterality: N/A;  ? TYMPANOSTOMY TUBE PLACEMENT Bilateral child  ? ? ?Current Outpatient Medications  ?Medication Sig Dispense Refill  ? albuterol (PROVENTIL HFA;VENTOLIN HFA) 108 (90 BASE) MCG/ACT inhaler Inhale 1-2 puffs into the lungs every 6 (six) hours as needed for wheezing or shortness of breath.     ? Ascorbic Acid (VITAMIN C PO) Take 500 mg by mouth.    ? levocetirizine (XYZAL) 5 MG tablet Take 5 mg by mouth every evening.    ? levothyroxine (SYNTHROID) 125 MCG tablet Take 1 tablet by mouth once daily in the morning on an empty stomach  90 tablet 3  ? naproxen sodium (ALEVE) 220 MG tablet Take 220 mg by mouth daily as needed (pain).    ? Prenatal Vit-Fe Sulfate-FA-DHA (PRENATAL VITAMIN/MIN +DHA) 27-0.8-200 MG CAPS Take 1 tablet by mouth daily. 90 capsule 4  ? Probiotic Product (PROBIOTIC PO) Take 1 capsule by mouth daily.     ? Red Yeast Rice 600 MG TABS Take 1,200 mg by mouth 2 (two) times daily.     ? VITAMIN D PO Take 5,000 Int'l Units by mouth.    ? ?No current facility-administered medications for this visit.  ? ? ?Family History  ?Problem Relation Age of Onset  ? Hyperlipidemia Father   ? Hypertension Father   ? Diabetes Father   ?  Hyperlipidemia Mother   ? Osteopenia Mother   ? Diabetes Paternal Grandfather   ? Hypertension Paternal Grandfather   ? ? ?Review of Systems  ?All other systems reviewed and are negative. ? ?Exam:   ?BP 108/62   Pulse 84   Ht '5\' 3"'$  (1.6 m)   Wt 173 lb (78.5 kg)   LMP 04/02/2022 (Exact Date)   SpO2 98%   BMI 30.65 kg/m?     ?General appearance: alert, cooperative and appears stated age ?Head: normocephalic, without obvious abnormality, atraumatic ?Neck: no adenopathy, supple, symmetrical, trachea midline and thyroid normal to inspection and palpation ?Lungs: clear to auscultation bilaterally ?Breasts: normal appearance, no masses or tenderness, No nipple retraction or dimpling, No nipple discharge or bleeding, No axillary adenopathy ?Heart: regular rate and rhythm ?Abdomen: soft, non-tender; no masses, no organomegaly ?Extremities: extremities normal, atraumatic, no cyanosis or edema ?Skin: skin color, texture, turgor normal. No rashes or lesions ?Lymph nodes: cervical, supraclavicular, and axillary nodes normal. ?Neurologic: grossly normal ? ?Pelvic: External genitalia:  no lesions ?             No abnormal inguinal nodes palpated. ?             Urethra:  normal appearing urethra with no masses, tenderness or lesions ?             Bartholins and Skenes: normal    ?             Vagina: normal appearing vagina with normal color and discharge, no lesions ?             Cervix: no lesions.  Mild menstrual flow.  ?             Pap taken: no ?Bimanual Exam:  Uterus:  normal size, contour, position, consistency, mobility, non-tender ?             Adnexa: no mass, fullness, tenderness ?  ? ?Chaperone was present for exam:  Raquel Sarna, RN ? ?Assessment:   ?Well woman visit with gynecologic exam. ?Status post right salpingostomy, removal of ectopic pregnancy, laparoscopic drainage of bilateral ovarian cysts, peritoneal biopsy.  ?Status post thyroidectomy.  Final pathology benign.  On Synthroid.  ? ?Plan: ?Mammogram screening  discussed. ?Self breast awareness reviewed. ?Pap and HR HPV 2025. ?Guidelines for Calcium, Vitamin D, regular exercise program including cardiovascular and weight bearing exercise. ?  ?Follow up annually and prn.  ? ?After visit summary provided.  ? ? ? ?

## 2022-04-07 ENCOUNTER — Ambulatory Visit (INDEPENDENT_AMBULATORY_CARE_PROVIDER_SITE_OTHER): Payer: 59 | Admitting: Obstetrics and Gynecology

## 2022-04-07 ENCOUNTER — Encounter: Payer: Self-pay | Admitting: Obstetrics and Gynecology

## 2022-04-07 VITALS — BP 108/62 | HR 84 | Ht 63.0 in | Wt 173.0 lb

## 2022-04-07 DIAGNOSIS — Z01419 Encounter for gynecological examination (general) (routine) without abnormal findings: Secondary | ICD-10-CM | POA: Diagnosis not present

## 2022-04-07 NOTE — Patient Instructions (Signed)

## 2022-04-27 ENCOUNTER — Other Ambulatory Visit (HOSPITAL_COMMUNITY): Payer: Self-pay

## 2022-04-29 ENCOUNTER — Other Ambulatory Visit (HOSPITAL_COMMUNITY): Payer: Self-pay

## 2022-04-29 MED ORDER — LEVOTHYROXINE SODIUM 125 MCG PO TABS
125.0000 ug | ORAL_TABLET | Freq: Every day | ORAL | 0 refills | Status: DC
  Filled 2022-04-29: qty 90, 90d supply, fill #0

## 2022-07-27 ENCOUNTER — Other Ambulatory Visit (HOSPITAL_COMMUNITY): Payer: Self-pay

## 2022-07-27 DIAGNOSIS — E7800 Pure hypercholesterolemia, unspecified: Secondary | ICD-10-CM | POA: Diagnosis not present

## 2022-07-27 DIAGNOSIS — E039 Hypothyroidism, unspecified: Secondary | ICD-10-CM | POA: Diagnosis not present

## 2022-07-27 DIAGNOSIS — Z79899 Other long term (current) drug therapy: Secondary | ICD-10-CM | POA: Diagnosis not present

## 2022-07-27 MED ORDER — LEVOTHYROXINE SODIUM 125 MCG PO TABS
ORAL_TABLET | ORAL | 4 refills | Status: DC
  Filled 2022-07-27 – 2022-08-07 (×2): qty 90, 90d supply, fill #0
  Filled 2022-11-20: qty 90, 90d supply, fill #1
  Filled 2023-02-26: qty 90, 90d supply, fill #2
  Filled 2023-06-22: qty 90, 90d supply, fill #3

## 2022-07-29 ENCOUNTER — Other Ambulatory Visit (HOSPITAL_COMMUNITY): Payer: Self-pay

## 2022-07-30 ENCOUNTER — Other Ambulatory Visit (HOSPITAL_COMMUNITY): Payer: Self-pay

## 2022-08-07 ENCOUNTER — Other Ambulatory Visit (HOSPITAL_COMMUNITY): Payer: Self-pay

## 2022-11-20 ENCOUNTER — Other Ambulatory Visit (HOSPITAL_COMMUNITY): Payer: Self-pay

## 2023-03-31 NOTE — Progress Notes (Signed)
42 y.o. G71P0020 Married Caucasian female here for annual exam.    Working on weight loss and is at a plateau.  Exercising.   Has elevated cholesterol.   Not preventing pregnancy.  Nausea with taking PNV.  Wants a pap today.   Bought a new home.   PCP:   Deboraha Sprang  Patient's last menstrual period was 03/29/2023.     Period Cycle (Days): 28 Period Duration (Days): 5 Period Pattern: Regular Menstrual Flow: Moderate Menstrual Control: Tampon, Maxi pad Dysmenorrhea: (!) Mild     Sexually active: Yes.    The current method of family planning is none.    Exercising: Yes.     150 min of exercise a week Smoker:  former  Health Maintenance: Pap:  02/08/19 neg: HR HPV neg, 01/21/17 neg History of abnormal Pap:  no MMG:  n/a Colonoscopy:  n/a BMD:   n/a  Result  n/a TDaP:  04/24/17 Gardasil:   no HIV: neg in the past Hep C: neg in the past Screening Labs:  PCP   reports that she quit smoking about 14 years ago. Her smoking use included cigarettes. She has never used smokeless tobacco. She reports current alcohol use of about 4.0 standard drinks of alcohol per week. She reports that she does not use drugs.  Past Medical History:  Diagnosis Date   Exercise-induced asthma    GERD (gastroesophageal reflux disease)    History of anemia teen   History of ectopic pregnancy 08/2017   s/p  right salpingectomy   Multiple thyroid nodules    Seasonal allergies    Sinus drainage    Thyroid neoplasm    Wears contact lenses     Past Surgical History:  Procedure Laterality Date   BIOPSY Right 08/31/2014   Procedure: BIOPSY right peritoneal side wall ;  Surgeon: Jacqualin Combes de Gwenevere Ghazi, MD;  Location: [facility];  Service: Gynecology;  Laterality: Right;   DILATION AND EVACUATION N/A 08/31/2014   Procedure: DILATATION AND EVACUATION, FROZEN SECTION;  Surgeon: Forrestine Him Amundson de Gwenevere Ghazi, MD;  Location: [facility];  Service: Gynecology;  Laterality: N/A;   LAPAROSCOPIC OVARIAN  CYSTECTOMY Right 08/31/2014   Procedure: LAPAROSCOPIC bilateral  OVARIAN CYSTs drainage  drainage of right  tubal cyst;  Surgeon: Jacqualin Combes de Gwenevere Ghazi, MD;  Location: [facility];  Service: Gynecology;  Laterality: Right;   LAPAROSCOPY N/A 08/31/2014   Procedure:  LAPAROSCOPY OPERATIVE  right salpinostomy ;  Surgeon: Jacqualin Combes de Gwenevere Ghazi, MD;  Location: [facility];  Service: Gynecology;  Laterality: N/A;   THYROIDECTOMY N/A 03/09/2019   Procedure: TOTAL THYROIDECTOMY;  Surgeon: Darnell Level, MD;  Location: [facility];  Service: General;  Laterality: N/A;   TYMPANOSTOMY TUBE PLACEMENT Bilateral child    Current Outpatient Medications  Medication Sig Dispense Refill   albuterol (PROVENTIL HFA;VENTOLIN HFA) 108 (90 BASE) MCG/ACT inhaler Inhale 1-2 puffs into the lungs every 6 (six) hours as needed for wheezing or shortness of breath.      Ascorbic Acid (VITAMIN C PO) Take 500 mg by mouth.     levocetirizine (XYZAL) 5 MG tablet Take 5 mg by mouth every evening.     levothyroxine (SYNTHROID) 125 MCG tablet Take 1 tablet by mouth in the morning on an empty stomach 90 tablet 4   naproxen sodium (ALEVE) 220 MG tablet Take 220 mg by mouth daily as needed (pain).     Red Yeast Rice 600 MG TABS  Take 1,200 mg by mouth 2 (two) times daily.      VITAMIN D PO Take 5,000 Int'l Units by mouth.     No current facility-administered medications for this visit.    Family History  Problem Relation Age of Onset   Hyperlipidemia Father    Hypertension Father    Diabetes Father    Hyperlipidemia Mother    Osteopenia Mother    Diabetes Paternal Grandfather    Hypertension Paternal Grandfather     Review of Systems  See HPI.  Exam:   BP 118/72 (BP Location: Right Arm, Patient Position: Sitting, Cuff Size: Normal)   Pulse 91   Ht 5' 2.5" (1.588 m)   Wt 176 lb (79.8 kg)   LMP 03/29/2023   SpO2 98%   BMI 31.68 kg/m     General appearance: alert, cooperative and appears stated age Head:  normocephalic, without obvious abnormality, atraumatic Neck: no adenopathy, supple, symmetrical, trachea midline and thyroid normal to inspection and palpation Lungs: clear to auscultation bilaterally Breasts: normal appearance, no masses or tenderness, No nipple retraction or dimpling, No nipple discharge or bleeding, No axillary adenopathy Heart: regular rate and rhythm Abdomen: soft, non-tender; no masses, no organomegaly Extremities: extremities normal, atraumatic, no cyanosis or edema Skin: skin color, texture, turgor normal. No rashes or lesions Lymph nodes: cervical, supraclavicular, and axillary nodes normal. Neurologic: grossly normal  Pelvic: External genitalia:  no lesions              No abnormal inguinal nodes palpated.              Urethra:  normal appearing urethra with no masses, tenderness or lesions              Bartholins and Skenes: normal                 Vagina: normal appearing vagina with normal color and discharge, no lesions              Cervix: no lesions              Pap taken: yes Bimanual Exam:  Uterus:  normal size, contour, position, consistency, mobility, non-tender              Adnexa: no mass, fullness, tenderness              Rectal exam: yes.  Confirms.              Anus:  normal sphincter tone, no lesions  Chaperone was present for exam:  Warren Lacy, CMA  Assessment:   Well woman visit with gynecologic exam. Status post right salpingostomy, removal of ectopic pregnancy, laparoscopic drainage of bilateral ovarian cysts, peritoneal biopsy.  Status post thyroidectomy.  Final pathology benign.  On Synthroid.   Plan: Mammogram screening discussed.  She will schedule  Self breast awareness reviewed. Pap and HR HPV collected. Guidelines for Calcium, Vitamin D, regular exercise program including cardiovascular and weight bearing exercise. Take folic acid 400 mcg daily.  Follow up annually and prn.   After visit summary provided.

## 2023-04-14 ENCOUNTER — Ambulatory Visit (INDEPENDENT_AMBULATORY_CARE_PROVIDER_SITE_OTHER): Payer: 59 | Admitting: Obstetrics and Gynecology

## 2023-04-14 ENCOUNTER — Encounter: Payer: Self-pay | Admitting: Obstetrics and Gynecology

## 2023-04-14 ENCOUNTER — Other Ambulatory Visit (HOSPITAL_COMMUNITY)
Admission: RE | Admit: 2023-04-14 | Discharge: 2023-04-14 | Disposition: A | Discharge status: Home / Self Care | Payer: 59 | Source: Ambulatory Visit | Attending: Obstetrics and Gynecology | Admitting: Obstetrics and Gynecology

## 2023-04-14 VITALS — BP 118/72 | HR 91 | Ht 62.5 in | Wt 176.0 lb

## 2023-04-14 DIAGNOSIS — Z124 Encounter for screening for malignant neoplasm of cervix: Secondary | ICD-10-CM | POA: Insufficient documentation

## 2023-04-14 DIAGNOSIS — Z01419 Encounter for gynecological examination (general) (routine) without abnormal findings: Secondary | ICD-10-CM | POA: Diagnosis not present

## 2023-04-14 NOTE — Patient Instructions (Signed)

## 2023-04-15 LAB — CYTOLOGY - PAP
Adequacy: ABSENT
Comment: NEGATIVE
Diagnosis: NEGATIVE
High risk HPV: NEGATIVE

## 2023-04-19 ENCOUNTER — Other Ambulatory Visit: Payer: Self-pay | Admitting: Obstetrics and Gynecology

## 2023-04-19 DIAGNOSIS — Z1231 Encounter for screening mammogram for malignant neoplasm of breast: Secondary | ICD-10-CM

## 2023-05-24 ENCOUNTER — Ambulatory Visit: Payer: BLUE CROSS/BLUE SHIELD

## 2023-06-03 ENCOUNTER — Ambulatory Visit
Admission: RE | Admit: 2023-06-03 | Discharge: 2023-06-03 | Disposition: A | Discharge status: Home / Self Care | Payer: 59 | Source: Ambulatory Visit | Attending: Obstetrics and Gynecology | Admitting: Obstetrics and Gynecology

## 2023-06-03 DIAGNOSIS — Z1231 Encounter for screening mammogram for malignant neoplasm of breast: Secondary | ICD-10-CM | POA: Diagnosis not present

## 2023-06-09 ENCOUNTER — Ambulatory Visit: Payer: 59 | Admitting: Obstetrics and Gynecology

## 2023-06-22 ENCOUNTER — Other Ambulatory Visit (HOSPITAL_COMMUNITY): Payer: Self-pay

## 2023-06-22 ENCOUNTER — Other Ambulatory Visit: Payer: Self-pay

## 2023-06-23 ENCOUNTER — Other Ambulatory Visit (HOSPITAL_COMMUNITY): Payer: Self-pay

## 2023-08-03 ENCOUNTER — Other Ambulatory Visit (HOSPITAL_COMMUNITY): Payer: Self-pay

## 2023-08-03 DIAGNOSIS — E041 Nontoxic single thyroid nodule: Secondary | ICD-10-CM | POA: Diagnosis not present

## 2023-08-03 DIAGNOSIS — J45990 Exercise induced bronchospasm: Secondary | ICD-10-CM | POA: Diagnosis not present

## 2023-08-03 DIAGNOSIS — E7800 Pure hypercholesterolemia, unspecified: Secondary | ICD-10-CM | POA: Diagnosis not present

## 2023-08-03 DIAGNOSIS — E782 Mixed hyperlipidemia: Secondary | ICD-10-CM | POA: Diagnosis not present

## 2023-08-03 DIAGNOSIS — E039 Hypothyroidism, unspecified: Secondary | ICD-10-CM | POA: Diagnosis not present

## 2023-08-03 DIAGNOSIS — E669 Obesity, unspecified: Secondary | ICD-10-CM | POA: Diagnosis not present

## 2023-08-03 MED ORDER — LEVOTHYROXINE SODIUM 125 MCG PO TABS
125.0000 ug | ORAL_TABLET | Freq: Every morning | ORAL | 1 refills | Status: AC
  Filled 2023-08-03 – 2023-10-04 (×2): qty 90, 90d supply, fill #0
  Filled 2024-01-11: qty 90, 90d supply, fill #1

## 2023-08-10 ENCOUNTER — Other Ambulatory Visit (HOSPITAL_COMMUNITY): Payer: Self-pay

## 2023-08-11 ENCOUNTER — Other Ambulatory Visit (HOSPITAL_COMMUNITY): Payer: Self-pay

## 2023-09-06 ENCOUNTER — Other Ambulatory Visit (HOSPITAL_COMMUNITY): Payer: Self-pay

## 2023-09-07 DIAGNOSIS — Z0000 Encounter for general adult medical examination without abnormal findings: Secondary | ICD-10-CM | POA: Diagnosis not present

## 2023-09-07 DIAGNOSIS — E669 Obesity, unspecified: Secondary | ICD-10-CM | POA: Diagnosis not present

## 2023-09-07 DIAGNOSIS — Z131 Encounter for screening for diabetes mellitus: Secondary | ICD-10-CM | POA: Diagnosis not present

## 2023-09-07 DIAGNOSIS — Z6831 Body mass index (BMI) 31.0-31.9, adult: Secondary | ICD-10-CM | POA: Diagnosis not present

## 2023-09-07 DIAGNOSIS — Z130 Encounter for screening for diseases of the blood and blood-forming organs and certain disorders involving the immune mechanism: Secondary | ICD-10-CM | POA: Diagnosis not present

## 2023-10-04 ENCOUNTER — Other Ambulatory Visit: Payer: Self-pay

## 2023-10-06 ENCOUNTER — Other Ambulatory Visit (HOSPITAL_COMMUNITY): Payer: Self-pay

## 2023-10-08 ENCOUNTER — Other Ambulatory Visit (HOSPITAL_COMMUNITY): Payer: Self-pay

## 2024-04-03 ENCOUNTER — Other Ambulatory Visit (HOSPITAL_COMMUNITY): Payer: Self-pay

## 2024-04-04 ENCOUNTER — Other Ambulatory Visit (HOSPITAL_COMMUNITY): Payer: Self-pay

## 2024-04-04 MED ORDER — LEVOTHYROXINE SODIUM 125 MCG PO TABS
125.0000 ug | ORAL_TABLET | Freq: Every morning | ORAL | 0 refills | Status: DC
  Filled 2024-04-04: qty 90, 90d supply, fill #0

## 2024-04-14 ENCOUNTER — Other Ambulatory Visit (HOSPITAL_COMMUNITY): Payer: Self-pay

## 2024-04-20 ENCOUNTER — Telehealth: Admitting: Physician Assistant

## 2024-04-20 DIAGNOSIS — L237 Allergic contact dermatitis due to plants, except food: Secondary | ICD-10-CM

## 2024-04-20 MED ORDER — PREDNISONE 10 MG PO TABS
ORAL_TABLET | ORAL | 0 refills | Status: DC
  Filled 2024-04-20 – 2024-04-21 (×2): qty 37, 14d supply, fill #0

## 2024-04-20 NOTE — Patient Instructions (Signed)
 Lakeesha Delaine Favorite, thank you for joining Wanita Gutta, PA-C for today's virtual visit.  While this provider is not your primary care provider (PCP), if your PCP is located in our provider database this encounter information will be shared with them immediately following your visit.   A [facility] MyChart account gives you access to today's visit and all your visits, tests, and labs performed at [facility] " click here if you don't have a [facility] MyChart account or go to [email or web address]  Consent: (Patient) Nicole [name] provided verbal consent for this virtual visit at the beginning of the encounter.  Current Medications:  Current Outpatient Medications:    predniSONE (DELTASONE) 10 MG tablet, Take 4 tablets (40mg ) on days 1-4, then 3 tablets (30mg ) on days 5-8, then 2 tablets (20mg ) on days 9-11, then 1 tablet daily for days 12-14. Take with food., Disp: 37 tablet, Rfl: 0   albuterol  (PROVENTIL  HFA;VENTOLIN  HFA) 108 (90 BASE) MCG/ACT inhaler, Inhale 1-2 puffs into the lungs every 6 (six) hours as needed for wheezing or shortness of breath. , Disp: , Rfl:    Ascorbic Acid (VITAMIN C PO), Take 500 mg by mouth., Disp: , Rfl:    levocetirizine (XYZAL) 5 MG tablet, Take 5 mg by mouth every evening., Disp: , Rfl:    levothyroxine  (SYNTHROID ) 125 MCG tablet, Take 1 tablet (125 mcg total) by mouth in the morning on an empty stomach, Disp: 90 tablet, Rfl: 1   levothyroxine  (SYNTHROID ) 125 MCG tablet, Take 1 tablet (125 mcg total) by mouth every morning on an empty stomach, Disp: 90 tablet, Rfl: 0   naproxen  sodium (ALEVE ) 220 MG tablet, Take 220 mg by mouth daily as needed (pain)., Disp: , Rfl:    Red Yeast Rice 600 MG TABS, Take 1,200 mg by mouth 2 (two) times daily. , Disp: , Rfl:    VITAMIN D PO, Take 5,000 Int'l Units by mouth., Disp: , Rfl:    Medications ordered in this encounter:  Meds ordered this encounter  Medications   predniSONE (DELTASONE) 10 MG tablet    Sig: Take 4  tablets (40mg ) on days 1-4, then 3 tablets (30mg ) on days 5-8, then 2 tablets (20mg ) on days 9-11, then 1 tablet daily for days 12-14. Take with food.    Dispense:  37 tablet    Refill:  0    Supervising Provider:   Corine Dice [[id number]]     *If you need refills on other medications prior to your next appointment, please contact your pharmacy*  Follow-Up: Call back or seek an in-person evaluation if the symptoms worsen or if the condition fails to improve as anticipated.  [facility] [phone]  Other Instructions Start medicines as prescribed. Continue to watch for worsening symptoms. Schedule a virtual appointment or follow up at an urgent care clinic if symptoms don't improve.   If you have been instructed to have an in-person evaluation today at a local Urgent Care facility, please use the link below. It will take you to a list of all of our available [facility], including address, phone number and hours of operation. Please do not delay care.  [facility]  If you or a family member do not have a primary care provider, use the link below to schedule a visit and establish care. When you choose a [facility] primary care physician or advanced practice provider, you gain a long-term partner in health. Find a Primary  Care Provider  Learn more about [facility] in-office and virtual care options: [facility] - Get Care Now

## 2024-04-20 NOTE — Progress Notes (Addendum)
 Virtual Visit Consent   [name], you are scheduled for a virtual visit with a  provider today. Just as with appointments in the office, your consent must be obtained to participate. Your consent will be active for this visit and any virtual visit you may have with one of our providers in the next 365 days. If you have a MyChart account, a copy of this consent can be sent to you electronically.  As this is a virtual visit, video technology does not allow for your provider to perform a traditional examination. This may limit your provider's ability to fully assess your condition. If your provider identifies any concerns that need to be evaluated in person or the need to arrange testing (such as labs, EKG, etc.), we will make arrangements to do so. Although advances in technology are sophisticated, we cannot ensure that it will always work on either your end or our end. If the connection with a video visit is poor, the visit may have to be switched to a telephone visit. With either a video or telephone visit, we are not always able to ensure that we have a secure connection.  By engaging in this virtual visit, you consent to the provision of healthcare and authorize for your insurance to be billed (if applicable) for the services provided during this visit. Depending on your insurance coverage, you may receive a charge related to this service.  I need to obtain your verbal consent now. Are you willing to proceed with your visit today? Nicole [name] has provided verbal consent on 04/20/2024 for a virtual visit (video or telephone). Wanita Gutta, PA-C  Date: 04/20/2024 6:23 PM   Virtual Visit via Video Note   I, Nicole [name], connected with  Nicole [name]  ([id number], 06-Nov-1981) on 04/20/24 at  6:30 PM EDT by a video-enabled telemedicine application and verified that I am speaking with the correct person using two identifiers.  Location: Patient: Virtual Visit Location Patient: Home Provider:  Virtual Visit Location Provider: Home Office   I discussed the limitations of evaluation and management by telemedicine and the availability of in person appointments. The patient expressed understanding and agreed to proceed.    History of Present Illness: Nicole [name] is a 43 y.o. who identifies as a female who was assigned female at birth, and is being seen today for rash.  HPI: 43 y/o F presents via video telehealth visit for c/o poison ivy rash on left upper arm and upper chest/neck area x 5 days and getting worse.   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 Take with food., Disp: 37 tablet, Rfl: 0   albuterol  (PROVENTIL  HFA;VENTOLIN  HFA) 108 (90 BASE) MCG/ACT inhaler, Inhale 1-2 puffs into the lungs every 6 (six) hours as needed for wheezing or shortness of breath. , Disp: , Rfl:    Ascorbic Acid (VITAMIN C PO), Take 500 mg by mouth., Disp: , Rfl:    levocetirizine (XYZAL)  5 MG tablet, Take 5 mg by mouth every evening., Disp: , Rfl:    levothyroxine  (SYNTHROID ) 125 MCG tablet, Take 1 tablet (125 mcg total) by mouth in the morning on an empty stomach, Disp: 90 tablet, Rfl: 1   levothyroxine  (SYNTHROID ) 125 MCG tablet, Take 1 tablet (125 mcg total) by mouth every morning on an empty stomach, Disp: 90 tablet, Rfl: 0   naproxen  sodium (ALEVE ) 220 MG tablet, Take 220 mg by mouth daily as needed (pain)., Disp: , Rfl:    Red Yeast Rice 600 MG TABS, Take 1,200 mg by mouth 2 (two) times daily. , Disp: , Rfl:     VITAMIN D PO, Take 5,000 Int'l Units by mouth., Disp: , Rfl:   Observations/Objective: Patient is well-developed, well-nourished in no acute distress.  Resting comfortably  at home.  Head is normocephalic, atraumatic.  No labored breathing.  Speech is clear and coherent with logical content.  Patient is alert and oriented at baseline.    Assessment and Plan: 1. Poison ivy dermatitis (Primary) - predniSONE (DELTASONE) 10 MG tablet; Take 4 tablets (40mg ) on days 1-4, then 3 tablets (30mg ) on days 5-8, then 2 tablets (20mg ) on days 9-11, then 1 tablet daily for days 12-14. Take with food.  Dispense: 37 tablet; Refill: 0  Start medicines as prescribed. Continue to watch for worsening symptoms. Schedule a virtual appointment or follow up at an urgent care clinic if symptoms don't improve.  Pt verbalized understanding and in agreement.    Follow Up Instructions: I discussed the assessment and treatment plan with the patient. The patient was provided an opportunity to ask questions and all were answered. The patient agreed with the plan and demonstrated an understanding of the instructions.  A copy of instructions were sent to the patient via MyChart unless otherwise noted below.   Patient has requested to receive PHI (AVS, Work Notes, etc) pertaining to this video visit through e-mail as they are currently without active MyChart. They have voiced understand that email is not considered secure and their health information could be viewed by someone other than the patient.   The patient was advised to call back or seek an in-person evaluation if the symptoms worsen or if the condition fails to improve as anticipated.    Shaiden Aldous, PA-C

## 2024-04-21 ENCOUNTER — Other Ambulatory Visit (HOSPITAL_COMMUNITY): Payer: Self-pay

## 2024-05-22 ENCOUNTER — Other Ambulatory Visit (HOSPITAL_COMMUNITY): Payer: Self-pay

## 2024-06-02 ENCOUNTER — Other Ambulatory Visit: Payer: Self-pay | Admitting: Obstetrics and Gynecology

## 2024-06-02 DIAGNOSIS — Z1231 Encounter for screening mammogram for malignant neoplasm of breast: Secondary | ICD-10-CM

## 2024-06-15 ENCOUNTER — Ambulatory Visit
Admission: RE | Admit: 2024-06-15 | Discharge: 2024-06-15 | Disposition: A | Discharge status: Home / Self Care | Source: Ambulatory Visit

## 2024-06-15 DIAGNOSIS — Z1231 Encounter for screening mammogram for malignant neoplasm of breast: Secondary | ICD-10-CM | POA: Diagnosis not present

## 2024-06-15 NOTE — Progress Notes (Deleted)
 43 y.o. G27P0020 Married Caucasian female here for annual exam.    PCP: Rankins, Richerd SAUNDERS, MD   No LMP recorded.           Sexually active: Yes.    The current method of family planning is none.    Menopausal hormone therapy:  n/a Exercising: {yes no:314532}  {types:19826} Smoker:  Former   OB History  Gravida Para Term Preterm AB Living  2 0 0 0 2 0  SAB IAB Ectopic Multiple Live Births  0 1 1 0     # Outcome Date GA Lbr Len/2nd Weight Sex Type Anes PTL Lv  2 IAB           1 Ectopic              Birth Comments: System Generated. Please review and update pregnancy details.     HEALTH MAINTENANCE: Last 2 paps:  04/14/23 neg HR HPV neg, 02/08/19 neg HPV neg  History of abnormal Pap or positive HPV:  no Mammogram:   06/03/23 Breast Density Cat B, BIRADS Cat 1 neg  Colonoscopy:  n/a Bone Density:  n/a  Result  n/a   Immunization History  Administered Date(s) Administered   DTaP 04/13/2017   Hepatitis B 12/14/2000   Influenza Split 09/04/2020, 10/16/2021   PFIZER(Purple Top)SARS-COV-2 Vaccination 11/24/2019, 12/23/2019, 10/03/2020   Tdap 12/14/2006, 04/27/2017      reports that she quit smoking about 15 years ago. Her smoking use included cigarettes. She started smoking about 25 years ago. She has never used smokeless tobacco. She reports current alcohol use of about 4.0 standard drinks of alcohol per week. She reports that she does not use drugs.  Past Medical History:  Diagnosis Date   Exercise-induced asthma    GERD (gastroesophageal reflux disease)    History of anemia teen   History of ectopic pregnancy 08/2017   s/p  right salpingectomy   Multiple thyroid  nodules    Seasonal allergies    Sinus drainage    Thyroid  neoplasm    Wears contact lenses     Past Surgical History:  Procedure Laterality Date   BIOPSY Right 08/31/2014   Procedure: BIOPSY right peritoneal side wall ;  Surgeon: Bobie FORBES Crown de Charlynn FORBES Cary, MD;  Location: [facility];  Service:  Gynecology;  Laterality: Right;   DILATION AND EVACUATION N/A 08/31/2014   Procedure: DILATATION AND EVACUATION, FROZEN SECTION;  Surgeon: Bobie FORBES Amundson de Charlynn FORBES Cary, MD;  Location: [facility];  Service: Gynecology;  Laterality: N/A;   LAPAROSCOPIC OVARIAN CYSTECTOMY Right 08/31/2014   Procedure: LAPAROSCOPIC bilateral  OVARIAN CYSTs drainage  drainage of right  tubal cyst;  Surgeon: Bobie FORBES Crown de Charlynn FORBES Cary, MD;  Location: [facility];  Service: Gynecology;  Laterality: Right;   LAPAROSCOPY N/A 08/31/2014   Procedure:  LAPAROSCOPY OPERATIVE  right salpinostomy ;  Surgeon: Bobie FORBES Crown de Charlynn FORBES Cary, MD;  Location: [facility];  Service: Gynecology;  Laterality: N/A;   THYROIDECTOMY N/A 03/09/2019   Procedure: TOTAL THYROIDECTOMY;  Surgeon: Eletha Boas, MD;  Location: [facility];  Service: General;  Laterality: N/A;   TYMPANOSTOMY TUBE PLACEMENT Bilateral child    Current Outpatient Medications  Medication Sig Dispense Refill   albuterol  (PROVENTIL  HFA;VENTOLIN  HFA) 108 (90 BASE) MCG/ACT inhaler Inhale 1-2 puffs into the lungs every 6 (six) hours as needed for wheezing or shortness of breath.      Ascorbic Acid (VITAMIN C PO) Take 500 mg by  mouth.     levocetirizine (XYZAL) 5 MG tablet Take 5 mg by mouth every evening.     levothyroxine  (SYNTHROID ) 125 MCG tablet Take 1 tablet (125 mcg total) by mouth in the morning on an empty stomach 90 tablet 1   levothyroxine  (SYNTHROID ) 125 MCG tablet Take 1 tablet (125 mcg total) by mouth every morning on an empty stomach 90 tablet 0   naproxen  sodium (ALEVE ) 220 MG tablet Take 220 mg by mouth daily as needed (pain).     predniSONE  (DELTASONE ) 10 MG tablet Take 4 tablets on days 1 - 4, then 3 tablets on days 5 - 8, then 2 tablets on days 9 - 11, then 1 tablet daily for days 12 - 14. Take with food. 37 tablet 0   Red Yeast Rice 600 MG TABS Take 1,200 mg by mouth 2 (two) times daily.      VITAMIN D PO Take 5,000 Int'l Units by mouth.     No  current facility-administered medications for this visit.    ALLERGIES: Tramadol   Family History  Problem Relation Age of Onset   Hyperlipidemia Father    Hypertension Father    Diabetes Father    Hyperlipidemia Mother    Osteopenia Mother    Diabetes Paternal Grandfather    Hypertension Paternal Grandfather     Review of Systems  PHYSICAL EXAM:  There were no vitals taken for this visit.    General appearance: alert, cooperative and appears stated age Head: normocephalic, without obvious abnormality, atraumatic Neck: no adenopathy, supple, symmetrical, trachea midline and thyroid  normal to inspection and palpation Lungs: clear to auscultation bilaterally Breasts: normal appearance, no masses or tenderness, No nipple retraction or dimpling, No nipple discharge or bleeding, No axillary adenopathy Heart: regular rate and rhythm Abdomen: soft, non-tender; no masses, no organomegaly Extremities: extremities normal, atraumatic, no cyanosis or edema Skin: skin color, texture, turgor normal. No rashes or lesions Lymph nodes: cervical, supraclavicular, and axillary nodes normal. Neurologic: grossly normal  Pelvic: External genitalia:  no lesions              No abnormal inguinal nodes palpated.              Urethra:  normal appearing urethra with no masses, tenderness or lesions              Bartholins and Skenes: normal                 Vagina: normal appearing vagina with normal color and discharge, no lesions              Cervix: no lesions              Pap taken: {yes no:314532} Bimanual Exam:  Uterus:  normal size, contour, position, consistency, mobility, non-tender              Adnexa: no mass, fullness, tenderness              Rectal exam: {yes no:314532}.  Confirms.              Anus:  normal sphincter tone, no lesions  Chaperone was present for exam:  {BSCHAPERONE:31226::Emily F, CMA}  ASSESSMENT: Well woman visit with gynecologic exam.  PHQ-2-9:  ***  ***  PLAN: Mammogram screening discussed. Self breast awareness reviewed. Pap and HRV collected:  {yes no:314532} Guidelines for Calcium , Vitamin D, regular exercise program including cardiovascular and weight bearing exercise. Medication refills:  *** {LABS (Optional):23779} Follow up:  ***  Additional counseling given.  {yes [id number]. ***  total time was spent for this patient encounter, including preparation, face-to-face counseling with the patient, coordination of care, and documentation of the encounter in addition to doing the well woman visit with gynecologic exam.

## 2024-06-19 ENCOUNTER — Ambulatory Visit: Admitting: Obstetrics and Gynecology

## 2024-06-21 ENCOUNTER — Ambulatory Visit: Payer: Self-pay | Admitting: Obstetrics and Gynecology

## 2024-07-07 ENCOUNTER — Other Ambulatory Visit (HOSPITAL_BASED_OUTPATIENT_CLINIC_OR_DEPARTMENT_OTHER): Payer: Self-pay

## 2024-07-07 ENCOUNTER — Other Ambulatory Visit (HOSPITAL_COMMUNITY): Payer: Self-pay

## 2024-07-07 MED ORDER — LEVOTHYROXINE SODIUM 125 MCG PO TABS
125.0000 ug | ORAL_TABLET | Freq: Every morning | ORAL | 0 refills | Status: DC
  Filled 2024-07-07 – 2024-07-27 (×4): qty 90, 90d supply, fill #0

## 2024-07-17 ENCOUNTER — Other Ambulatory Visit (HOSPITAL_COMMUNITY): Payer: Self-pay

## 2024-07-19 DIAGNOSIS — L309 Dermatitis, unspecified: Secondary | ICD-10-CM | POA: Diagnosis not present

## 2024-07-26 ENCOUNTER — Other Ambulatory Visit (HOSPITAL_COMMUNITY): Payer: Self-pay

## 2024-07-26 MED ORDER — TRIAMCINOLONE ACETONIDE 0.1 % EX OINT
1.0000 | TOPICAL_OINTMENT | Freq: Two times a day (BID) | CUTANEOUS | 1 refills | Status: AC
  Filled 2024-07-26: qty 30, 15d supply, fill #0

## 2024-07-27 ENCOUNTER — Other Ambulatory Visit: Payer: Self-pay

## 2024-07-27 ENCOUNTER — Other Ambulatory Visit (HOSPITAL_COMMUNITY): Payer: Self-pay

## 2024-08-07 ENCOUNTER — Ambulatory Visit: Admitting: Obstetrics and Gynecology

## 2024-09-08 DIAGNOSIS — Z6831 Body mass index (BMI) 31.0-31.9, adult: Secondary | ICD-10-CM | POA: Diagnosis not present

## 2024-09-08 DIAGNOSIS — Z1389 Encounter for screening for other disorder: Secondary | ICD-10-CM | POA: Diagnosis not present

## 2024-09-08 DIAGNOSIS — E039 Hypothyroidism, unspecified: Secondary | ICD-10-CM | POA: Diagnosis not present

## 2024-09-08 DIAGNOSIS — Z0000 Encounter for general adult medical examination without abnormal findings: Secondary | ICD-10-CM | POA: Diagnosis not present

## 2024-09-08 DIAGNOSIS — E782 Mixed hyperlipidemia: Secondary | ICD-10-CM | POA: Diagnosis not present

## 2024-09-21 ENCOUNTER — Other Ambulatory Visit (HOSPITAL_COMMUNITY): Payer: Self-pay

## 2024-09-21 ENCOUNTER — Ambulatory Visit (INDEPENDENT_AMBULATORY_CARE_PROVIDER_SITE_OTHER): Admitting: Radiology

## 2024-09-21 ENCOUNTER — Encounter: Payer: Self-pay | Admitting: Radiology

## 2024-09-21 VITALS — BP 118/78 | HR 88 | Ht 63.0 in | Wt 178.8 lb

## 2024-09-21 DIAGNOSIS — R6889 Other general symptoms and signs: Secondary | ICD-10-CM | POA: Diagnosis not present

## 2024-09-21 DIAGNOSIS — Z01419 Encounter for gynecological examination (general) (routine) without abnormal findings: Secondary | ICD-10-CM | POA: Diagnosis not present

## 2024-09-21 DIAGNOSIS — Z1331 Encounter for screening for depression: Secondary | ICD-10-CM | POA: Diagnosis not present

## 2024-09-21 MED ORDER — LEVOTHYROXINE SODIUM 125 MCG PO TABS
125.0000 ug | ORAL_TABLET | Freq: Every morning | ORAL | 3 refills | Status: DC
  Filled 2024-09-21: qty 90, 90d supply, fill #0

## 2024-09-21 NOTE — Patient Instructions (Signed)

## 2024-09-21 NOTE — Progress Notes (Signed)
 Nicole [name] 07-27-1981 [id number]   History:  43 y.o. G2P0 presents for annual exam.Struggling with weight loss, walks 3 miles multiple times a week, eats a 1300 calorie diet. Thyroid  managed by PCP, changing offices soon. Labs up to date. Periods have become a bit heavier and more crampy in the past year.  Gynecologic History Patient's last menstrual period was 08/29/2024 (exact date). Period Cycle (Days): 22 (22-28) Period Duration (Days): 4 Period Pattern: Regular Menstrual Flow: Heavy Menstrual Control: Tampon (super tampons) Menstrual Control Change Freq (Hours): 3-4 Dysmenorrhea: (!) Moderate Dysmenorrhea Symptoms: Cramping, Diarrhea Contraception/Family planning: none Sexually active: yes Last Pap: 04/14/23. Results were: normal Last mammogram: 06/15/24. Results were: normal  Obstetric History OB History  Gravida Para Term Preterm AB Living  2 0 0 0 2 0  SAB IAB Ectopic Multiple Live Births  0 1 1 0     # Outcome Date GA Lbr Len/2nd Weight Sex Type Anes PTL Lv  2 IAB           1 Ectopic              Birth Comments: System Generated. Please review and update pregnancy details.       09/21/2024    1:38 PM  Depression screen PHQ 2/9  Decreased Interest 0  Down, Depressed, Hopeless 0  PHQ - 2 Score 0     The following portions of the patient's history were reviewed and updated as appropriate: allergies, current medications, past family history, past medical history, past social history, past surgical history, and problem list.  Review of Systems  All other systems reviewed and are negative.   Past medical history, past surgical history, family history and social history were all reviewed and documented in the EPIC chart.  Exam:  Vitals:   09/21/24 1329  BP: 118/78  Pulse: 88  SpO2: 97%  Weight: 178 lb 12.8 oz (81.1 kg)  Height: 5' 3 (1.6 m)   Body mass index is 31.67 kg/m.  Physical Exam Vitals and nursing note reviewed. Exam conducted with a chaperone  present.  Constitutional:      Appearance: Normal appearance. She is normal weight.  HENT:     Head: Normocephalic and atraumatic.  Neck:     Thyroid : No thyroid  mass, thyromegaly or thyroid  tenderness.  Cardiovascular:     Rate and Rhythm: Regular rhythm.     Heart sounds: Normal heart sounds.  Pulmonary:     Effort: Pulmonary effort is normal.     Breath sounds: Normal breath sounds.  Chest:  Breasts:    Breasts are symmetrical.     Right: Normal. No inverted nipple, mass, nipple discharge, skin change or tenderness.     Left: Normal. No inverted nipple, mass, nipple discharge, skin change or tenderness.  Abdominal:     General: Abdomen is flat. Bowel sounds are normal.     Palpations: Abdomen is soft.  Genitourinary:    General: Normal vulva.     Vagina: Normal. No vaginal discharge, bleeding or lesions.     Cervix: Normal. No discharge or lesion.     Uterus: Normal. Not enlarged and not tender.      Adnexa: Right adnexa normal and left adnexa normal.       Right: No mass, tenderness or fullness.         Left: No mass, tenderness or fullness.    Lymphadenopathy:     Upper Body:     Right upper body: No axillary adenopathy.  Left upper body: No axillary adenopathy.  Skin:    General: Skin is warm and dry.  Neurological:     Mental Status: She is alert and oriented to person, place, and time.  Psychiatric:        Mood and Affect: Mood normal.        Thought Content: Thought content normal.        Judgment: Judgment normal.      Dereck Keas, CMA present for exam  Assessment/Plan:   1. Well woman exam with routine gynecological exam (Primary) Pap 2027 Mammo yearly  2. Difficulty maintaining weight loss Increase protein and fiber 120g, 35g daily Add strength training exercises to help with weight loss  3. Depression screen    Return in about 1 year (around 09/21/2025) for Annual.  GINETTE COZIER B WHNP-BC 1:59 PM 09/21/2024

## 2024-10-30 ENCOUNTER — Other Ambulatory Visit (HOSPITAL_COMMUNITY): Payer: Self-pay

## 2024-10-30 MED ORDER — LEVOTHYROXINE SODIUM 125 MCG PO TABS
125.0000 ug | ORAL_TABLET | Freq: Every morning | ORAL | 3 refills | Status: AC
  Filled 2024-10-30: qty 90, 90d supply, fill #0
  Filled 2025-01-11: qty 90, 90d supply, fill #1

## 2024-12-03 ENCOUNTER — Other Ambulatory Visit (HOSPITAL_COMMUNITY): Payer: Self-pay

## 2024-12-03 ENCOUNTER — Telehealth

## 2024-12-03 DIAGNOSIS — J208 Acute bronchitis due to other specified organisms: Secondary | ICD-10-CM

## 2024-12-03 DIAGNOSIS — B9689 Other specified bacterial agents as the cause of diseases classified elsewhere: Secondary | ICD-10-CM

## 2024-12-03 MED ORDER — BENZONATATE 100 MG PO CAPS
100.0000 mg | ORAL_CAPSULE | Freq: Three times a day (TID) | ORAL | 0 refills | Status: AC | PRN
  Filled 2024-12-03: qty 30, 10d supply, fill #0

## 2024-12-03 MED ORDER — PREDNISONE 10 MG (21) PO TBPK
ORAL_TABLET | ORAL | 0 refills | Status: AC
  Filled 2024-12-03: qty 21, 6d supply, fill #0

## 2024-12-03 MED ORDER — AMOXICILLIN-POT CLAVULANATE 875-125 MG PO TABS
1.0000 | ORAL_TABLET | Freq: Two times a day (BID) | ORAL | 0 refills | Status: AC
  Filled 2024-12-03: qty 14, 7d supply, fill #0

## 2024-12-03 NOTE — Patient Instructions (Signed)
 " Nicole [name], thank you for joining Nicole Velma Lunger, PA-C for today's virtual visit.  While this provider is not your primary care provider (PCP), if your PCP is located in our provider database this encounter information will be shared with them immediately following your visit.   A [facility] MyChart account gives you access to today's visit and all your visits, tests, and labs performed at [facility]  click here if you don't have a  MyChart account or go to [email or web address]  Consent: (Patient) Nicole [name] provided verbal consent for this virtual visit at the beginning of the encounter.  Current Medications:  Current Outpatient Medications:    albuterol  (PROVENTIL  HFA;VENTOLIN  HFA) 108 (90 BASE) MCG/ACT inhaler, Inhale 1-2 puffs into the lungs every 6 (six) hours as needed for wheezing or shortness of breath. , Disp: , Rfl:    Ascorbic Acid (VITAMIN C PO), Take 500 mg by mouth., Disp: , Rfl:    levocetirizine (XYZAL) 5 MG tablet, Take 5 mg by mouth every evening., Disp: , Rfl:    levothyroxine  (SYNTHROID ) 125 MCG tablet, Take 1 tablet (125 mcg total) by mouth in the morning on an empty stomach, Disp: 90 tablet, Rfl: 1   levothyroxine  (SYNTHROID ) 125 MCG tablet, Take 1 tablet (125 mcg total) by mouth in the morning on an empty stomach, Disp: 90 tablet, Rfl: 3   naproxen  sodium (ALEVE ) 220 MG tablet, Take 220 mg by mouth daily as needed (pain)., Disp: , Rfl:    Red Yeast Rice 600 MG TABS, Take 1,200 mg by mouth 2 (two) times daily. , Disp: , Rfl:    triamcinolone  ointment (KENALOG ) 0.1 %, Apply twice a day to rash until clear, Disp: 30 g, Rfl: 1   VITAMIN D PO, Take 5,000 Int'l Units by mouth., Disp: , Rfl:    Medications ordered in this encounter:  No orders of the defined types were placed in this encounter.    *If you need refills on other medications prior to your next appointment, please contact your pharmacy*  Follow-Up: Call back or seek an  in-person evaluation if the symptoms worsen or if the condition fails to improve as anticipated.  [facility] [phone]  Other Instructions Take antibiotic (Augmentin ) as directed.  Increase fluids.  Get plenty of rest. Use Mucinex for congestion. Use the Tessalon  as directed for cough. Keep up with your vitamins. I have placed a steroid pack on file at the pharmacy in case of any worsening with asthma symptoms. Take a daily probiotic (I recommend Align or Culturelle, but even Activia Yogurt may be beneficial).  A humidifier placed in the bedroom may offer some relief for a dry, scratchy throat of nasal irritation.  Read information below on acute bronchitis. If you note any non-resolving, new, or worsening symptoms despite treatment, please seek an in-person evaluation ASAP.   Acute Bronchitis Bronchitis is when the airways that extend from the windpipe into the lungs get red, puffy, and painful (inflamed). Bronchitis often causes thick spit (mucus) to develop. This leads to a cough. A cough is the most common symptom of bronchitis. In acute bronchitis, the condition usually begins suddenly and goes away over time (usually in 2 weeks). Smoking, allergies, and asthma can make bronchitis worse. Repeated episodes of bronchitis may cause more lung problems.  HOME CARE Rest. Drink enough fluids to keep your pee (urine) clear or pale yellow (unless you need to limit fluids as told by your doctor). Only  take over-the-counter or prescription medicines as told by your doctor. Avoid smoking and secondhand smoke. These can make bronchitis worse. If you are a smoker, think about using nicotine gum or skin patches. Quitting smoking will help your lungs heal faster. Reduce the chance of getting bronchitis again by: Washing your hands often. Avoiding people with cold symptoms. Trying not to touch your hands to your mouth, nose, or eyes. Follow up with your doctor as told.  GET HELP  IF: Your symptoms do not improve after 1 week of treatment. Symptoms include: Cough. Fever. Coughing up thick spit. Body aches. Chest congestion. Chills. Shortness of breath. Sore throat.  GET HELP RIGHT AWAY IF:  You have an increased fever. You have chills. You have severe shortness of breath. You have bloody thick spit (sputum). You throw up (vomit) often. You lose too much body fluid (dehydration). You have a severe headache. You faint.  MAKE SURE YOU:  Understand these instructions. Will watch your condition. Will get help right away if you are not doing well or get worse. Document Released: 05/18/2008 Document Revised: 08/02/2013 Document Reviewed: 05/23/2013 [facility] Patient Information 2015 [place], [place]. This information is not intended to replace advice given to you by your health care provider. Make sure you discuss any questions you have with your health care provider.    If you have been instructed to have an in-person evaluation today at a local Urgent Care facility, please use the link below. It will take you to a list of all of our available [facility], including address, phone number and hours of operation. Please do not delay care.  [facility]  If you or a family member do not have a primary care provider, use the link below to schedule a visit and establish care. When you choose a [facility] primary care physician or advanced practice provider, you gain a long-term partner in health. Find a Primary Care Provider  Learn more about [facility]'s in-office and virtual care options: [facility] - Get Care Now  "

## 2024-12-03 NOTE — Progress Notes (Signed)
 " Virtual Visit Consent   Nicole [name], you are scheduled for a virtual visit with a [facility] provider today. Just as with appointments in the office, your consent must be obtained to participate. Your consent will be active for this visit and any virtual visit you may have with one of our providers in the next 365 days. If you have a MyChart account, a copy of this consent can be sent to you electronically.  As this is a virtual visit, video technology does not allow for your provider to perform a traditional examination. This may limit your provider's ability to fully assess your condition. If your provider identifies any concerns that need to be evaluated in person or the need to arrange testing (such as labs, EKG, etc.), we will make arrangements to do so. Although advances in technology are sophisticated, we cannot ensure that it will always work on either your end or our end. If the connection with a video visit is poor, the visit may have to be switched to a telephone visit. With either a video or telephone visit, we are not always able to ensure that we have a secure connection.  By engaging in this virtual visit, you consent to the provision of healthcare and authorize for your insurance to be billed (if applicable) for the services provided during this visit. Depending on your insurance coverage, you may receive a charge related to this service.  I need to obtain your verbal consent now. Are you willing to proceed with your visit today? Raynetta E Raulerson has provided verbal consent on 12/03/2024 for a virtual visit (video or telephone). Nicole [name], [place]  Date: 12/03/2024 8:52 AM   Virtual Visit via Video Note   I, Nicole [name], connected with  ALANNAH AVERHART  ([id number], Jan 07, 1981) on 12/03/2024 at  8:45 AM EST by a video-enabled telemedicine application and verified that I am speaking with the correct person using two identifiers.  Location: Patient: Virtual Visit Location  Patient: Home Provider: Virtual Visit Location Provider: Home Office   I discussed the limitations of evaluation and management by telemedicine and the availability of in person appointments. The patient expressed understanding and agreed to proceed.    History of Present Illness: Nicole [name] is a 43 y.o. who identifies as a female who was assigned female at birth, and is being seen today for 2 weeks of chest congestion and productive cough, worsening over past 24 or so hours with increased sputum production and fatigue. Denies fever. Some chest wall tenderness. Denies SOB outside of a coughing spell. 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 Has tested for COVID and flu which were negative.  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  Resting comfortably  at home.  Head is normocephalic, atraumatic.  No labored breathing.  Speech is clear and coherent with logical content.  Patient is alert and oriented at baseline.   Assessment and Plan: 1. Acute bacterial bronchitis (Primary) - amoxicillin -clavulanate (AUGMENTIN ) 875-125 MG tablet; Take 1 tablet by mouth 2 (two) times daily.  Dispense: 14 tablet; Refill: 0 - benzonatate  (TESSALON ) 100 MG capsule; Take 1 capsule (100 mg total)  by mouth 3 (three) times daily as needed for cough.  Dispense: 30 capsule; Refill: 0  Rx Augmentin .  Increase fluids.  Rest.  Saline nasal spray.  Probiotic.  Mucinex as directed.  Humidifier in bedroom. Tessalon  per orders. Giving history of reactive airway, placed steroid pack on file at pharmacy to start if there is any increased tightness or wheezing  despite use of albuterol .  Call or return to clinic if symptoms are not improving.   Follow Up Instructions: I discussed the assessment and treatment plan with the patient. The patient was provided an opportunity to ask questions and all were answered. The patient agreed with the plan and demonstrated an understanding of the instructions.  A copy of instructions were sent to the patient via MyChart unless otherwise noted below.   The patient was advised to call back or seek an in-person evaluation if the symptoms worsen or if the condition fails to improve as anticipated.    Nicole Velma Lunger, PA-C    [1]  Allergies Allergen Reactions   Tramadol      Other reaction(s): face swelling  [2]  Current Outpatient Medications:    amoxicillin -clavulanate (AUGMENTIN ) 875-125 MG tablet, Take 1 tablet by mouth 2 (two) times daily., Disp: 14 tablet, Rfl: 0   benzonatate  (TESSALON ) 100 MG capsule, Take 1 capsule (100 mg total) by mouth 3 (three) times daily as needed for cough., Disp: 30 capsule, Rfl: 0   predniSONE  (STERAPRED UNI-PAK 21 TAB) 10 MG (21) TBPK tablet, Take following package directions, Disp: 21 tablet, Rfl: 0   albuterol  (PROVENTIL  HFA;VENTOLIN  HFA) 108 (90 BASE) MCG/ACT inhaler, Inhale 1-2 puffs into the lungs every 6 (six) hours as needed for wheezing or shortness of breath. , Disp: , Rfl:    Ascorbic Acid (VITAMIN C PO), Take 500 mg by mouth., Disp: , Rfl:    levocetirizine (XYZAL) 5 MG tablet, Take 5 mg by mouth every evening., Disp: , Rfl:    levothyroxine  (SYNTHROID ) 125 MCG tablet, Take 1 tablet (125 mcg total) by mouth in the morning on an empty stomach, Disp: 90 tablet, Rfl: 1   levothyroxine  (SYNTHROID ) 125 MCG tablet, Take 1 tablet (125 mcg total) by mouth in the morning on an empty stomach, Disp: 90 tablet, Rfl: 3   naproxen  sodium (ALEVE ) 220 MG tablet, Take 220 mg by mouth daily as needed (pain)., Disp: , Rfl:    Red Yeast Rice 600 MG TABS, Take 1,200 mg by mouth 2 (two)  times daily. , Disp: , Rfl:    triamcinolone  ointment (KENALOG ) 0.1 %, Apply twice a day to rash until clear, Disp: 30 g, Rfl: 1   VITAMIN D PO, Take 5,000 Int'l Units by mouth., Disp: , Rfl:   "

## 2025-01-11 ENCOUNTER — Other Ambulatory Visit: Payer: Self-pay

## 2025-01-30 ENCOUNTER — Ambulatory Visit: Admitting: Family Medicine
